# Patient Record
Sex: Female | Born: 1985 | Race: White | Hispanic: No | Marital: Married | Smoking: Never smoker
Health system: Southern US, Community
[De-identification: ages and names within clinical notes are randomized; demographics above are authoritative.]

## PROBLEM LIST (undated history)

## (undated) DIAGNOSIS — M549 Dorsalgia, unspecified: Secondary | ICD-10-CM

## (undated) DIAGNOSIS — M5430 Sciatica, unspecified side: Secondary | ICD-10-CM

## (undated) DIAGNOSIS — F419 Anxiety disorder, unspecified: Secondary | ICD-10-CM

## (undated) DIAGNOSIS — R87619 Unspecified abnormal cytological findings in specimens from cervix uteri: Secondary | ICD-10-CM

## (undated) DIAGNOSIS — K829 Disease of gallbladder, unspecified: Secondary | ICD-10-CM

## (undated) DIAGNOSIS — M2550 Pain in unspecified joint: Secondary | ICD-10-CM

## (undated) DIAGNOSIS — K5900 Constipation, unspecified: Secondary | ICD-10-CM

## (undated) DIAGNOSIS — E559 Vitamin D deficiency, unspecified: Secondary | ICD-10-CM

## (undated) DIAGNOSIS — E039 Hypothyroidism, unspecified: Secondary | ICD-10-CM

## (undated) DIAGNOSIS — E785 Hyperlipidemia, unspecified: Secondary | ICD-10-CM

## (undated) DIAGNOSIS — E079 Disorder of thyroid, unspecified: Secondary | ICD-10-CM

## (undated) DIAGNOSIS — I10 Essential (primary) hypertension: Secondary | ICD-10-CM

## (undated) DIAGNOSIS — E282 Polycystic ovarian syndrome: Secondary | ICD-10-CM

## (undated) DIAGNOSIS — R079 Chest pain, unspecified: Secondary | ICD-10-CM

## (undated) HISTORY — DX: Disease of gallbladder, unspecified: K82.9

## (undated) HISTORY — DX: Vitamin D deficiency, unspecified: E55.9

## (undated) HISTORY — DX: Polycystic ovarian syndrome: E28.2

## (undated) HISTORY — DX: Pain in unspecified joint: M25.50

## (undated) HISTORY — PX: OOPHORECTOMY: SHX86

## (undated) HISTORY — DX: Constipation, unspecified: K59.00

## (undated) HISTORY — DX: Dorsalgia, unspecified: M54.9

## (undated) HISTORY — DX: Unspecified abnormal cytological findings in specimens from cervix uteri: R87.619

## (undated) HISTORY — DX: Anxiety disorder, unspecified: F41.9

## (undated) HISTORY — DX: Essential (primary) hypertension: I10

## (undated) HISTORY — DX: Hypothyroidism, unspecified: E03.9

## (undated) HISTORY — DX: Hyperlipidemia, unspecified: E78.5

## (undated) HISTORY — DX: Chest pain, unspecified: R07.9

## (undated) HISTORY — DX: Sciatica, unspecified side: M54.30

## (undated) HISTORY — DX: Disorder of thyroid, unspecified: E07.9

---
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
## 2008-05-10 HISTORY — PX: CHOLECYSTECTOMY: SHX55

## 2013-05-10 DIAGNOSIS — R87619 Unspecified abnormal cytological findings in specimens from cervix uteri: Secondary | ICD-10-CM

## 2013-05-10 HISTORY — DX: Unspecified abnormal cytological findings in specimens from cervix uteri: R87.619

## 2013-05-10 HISTORY — PX: DILATION AND CURETTAGE OF UTERUS: SHX78

## 2016-07-26 DIAGNOSIS — E039 Hypothyroidism, unspecified: Secondary | ICD-10-CM | POA: Diagnosis not present

## 2016-07-26 DIAGNOSIS — Z0001 Encounter for general adult medical examination with abnormal findings: Secondary | ICD-10-CM | POA: Diagnosis not present

## 2016-07-26 DIAGNOSIS — E559 Vitamin D deficiency, unspecified: Secondary | ICD-10-CM | POA: Diagnosis not present

## 2016-07-30 DIAGNOSIS — E559 Vitamin D deficiency, unspecified: Secondary | ICD-10-CM | POA: Diagnosis not present

## 2016-07-30 DIAGNOSIS — Z131 Encounter for screening for diabetes mellitus: Secondary | ICD-10-CM | POA: Diagnosis not present

## 2016-07-30 DIAGNOSIS — E039 Hypothyroidism, unspecified: Secondary | ICD-10-CM | POA: Diagnosis not present

## 2016-07-30 DIAGNOSIS — Z13220 Encounter for screening for lipoid disorders: Secondary | ICD-10-CM | POA: Diagnosis not present

## 2016-09-01 ENCOUNTER — Other Ambulatory Visit (HOSPITAL_COMMUNITY)
Admission: RE | Admit: 2016-09-01 | Discharge: 2016-09-01 | Disposition: A | Discharge status: Home / Self Care | Payer: 59 | Source: Ambulatory Visit | Attending: Obstetrics and Gynecology | Admitting: Obstetrics and Gynecology

## 2016-09-01 ENCOUNTER — Other Ambulatory Visit: Payer: Self-pay | Admitting: Obstetrics and Gynecology

## 2016-09-01 DIAGNOSIS — Z1151 Encounter for screening for human papillomavirus (HPV): Secondary | ICD-10-CM | POA: Insufficient documentation

## 2016-09-01 DIAGNOSIS — R102 Pelvic and perineal pain: Secondary | ICD-10-CM | POA: Diagnosis not present

## 2016-09-01 DIAGNOSIS — Z01419 Encounter for gynecological examination (general) (routine) without abnormal findings: Secondary | ICD-10-CM | POA: Diagnosis not present

## 2016-09-03 LAB — CYTOLOGY - PAP
Chlamydia: NEGATIVE
Diagnosis: NEGATIVE
HPV: NOT DETECTED
Neisseria Gonorrhea: NEGATIVE

## 2016-10-05 DIAGNOSIS — N926 Irregular menstruation, unspecified: Secondary | ICD-10-CM | POA: Diagnosis not present

## 2016-10-05 DIAGNOSIS — R7303 Prediabetes: Secondary | ICD-10-CM | POA: Diagnosis not present

## 2016-10-05 DIAGNOSIS — R1900 Intra-abdominal and pelvic swelling, mass and lump, unspecified site: Secondary | ICD-10-CM | POA: Diagnosis not present

## 2016-10-05 DIAGNOSIS — R102 Pelvic and perineal pain: Secondary | ICD-10-CM | POA: Diagnosis not present

## 2016-10-12 ENCOUNTER — Other Ambulatory Visit: Payer: Self-pay | Admitting: Obstetrics and Gynecology

## 2016-10-12 DIAGNOSIS — R1900 Intra-abdominal and pelvic swelling, mass and lump, unspecified site: Secondary | ICD-10-CM

## 2016-10-14 ENCOUNTER — Ambulatory Visit
Admission: RE | Admit: 2016-10-14 | Discharge: 2016-10-14 | Disposition: A | Discharge status: Home / Self Care | Payer: 59 | Source: Ambulatory Visit | Attending: Obstetrics and Gynecology | Admitting: Obstetrics and Gynecology

## 2016-10-14 DIAGNOSIS — R1900 Intra-abdominal and pelvic swelling, mass and lump, unspecified site: Secondary | ICD-10-CM

## 2016-10-14 MED ORDER — IOPAMIDOL (ISOVUE-300) INJECTION 61%
125.0000 mL | Freq: Once | INTRAVENOUS | Status: AC | PRN
  Administered 2016-10-14: 125 mL via INTRAVENOUS

## 2016-11-15 DIAGNOSIS — R102 Pelvic and perineal pain: Secondary | ICD-10-CM | POA: Diagnosis not present

## 2016-12-01 DIAGNOSIS — N912 Amenorrhea, unspecified: Secondary | ICD-10-CM | POA: Diagnosis not present

## 2016-12-03 DIAGNOSIS — N911 Secondary amenorrhea: Secondary | ICD-10-CM | POA: Diagnosis not present

## 2016-12-10 DIAGNOSIS — N911 Secondary amenorrhea: Secondary | ICD-10-CM | POA: Diagnosis not present

## 2016-12-13 ENCOUNTER — Ambulatory Visit: Payer: 59 | Attending: Gynecology | Admitting: Gynecology

## 2016-12-13 ENCOUNTER — Encounter: Payer: Self-pay | Admitting: Gynecology

## 2016-12-13 VITALS — BP 133/81 | HR 70 | Temp 98.6°F | Resp 16 | Ht 68.0 in | Wt 253.0 lb

## 2016-12-13 DIAGNOSIS — Z3A01 Less than 8 weeks gestation of pregnancy: Secondary | ICD-10-CM | POA: Diagnosis not present

## 2016-12-13 DIAGNOSIS — O3481 Maternal care for other abnormalities of pelvic organs, first trimester: Secondary | ICD-10-CM | POA: Diagnosis not present

## 2016-12-13 DIAGNOSIS — O26891 Other specified pregnancy related conditions, first trimester: Secondary | ICD-10-CM | POA: Insufficient documentation

## 2016-12-13 DIAGNOSIS — N839 Noninflammatory disorder of ovary, fallopian tube and broad ligament, unspecified: Secondary | ICD-10-CM

## 2016-12-13 DIAGNOSIS — E079 Disorder of thyroid, unspecified: Secondary | ICD-10-CM | POA: Diagnosis not present

## 2016-12-13 DIAGNOSIS — Z79899 Other long term (current) drug therapy: Secondary | ICD-10-CM | POA: Diagnosis not present

## 2016-12-13 DIAGNOSIS — O99282 Endocrine, nutritional and metabolic diseases complicating pregnancy, second trimester: Secondary | ICD-10-CM | POA: Diagnosis not present

## 2016-12-13 DIAGNOSIS — R1900 Intra-abdominal and pelvic swelling, mass and lump, unspecified site: Secondary | ICD-10-CM

## 2016-12-13 NOTE — Patient Instructions (Signed)
Plan to follow up with Dr. Andrey Farmerossi after having ultrasound at 14 weeks to discuss potential surgery.  Please call [phone] for any questions or concerns.

## 2016-12-13 NOTE — Progress Notes (Signed)
Consult Note: Gyn-Onc   Cathy Vargas Cathy Vargas 31 y.o. female  Chief Complaint  Patient presents with  . 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 A CT scan obtained previously (10/14/2016) measured the mass at 6.1 x 7.5 x 6.8 cm. The patient has negative tumor markers including CA-125 and CEA.  Plan: I lengthy discussion with the patient indicating that I believe this is a benign dermoid. The pros and cons of surgical removal versus observation to pregnancy were discussed. If surgical intervention was to be undertaken we recommend this be done between 16 and [redacted] weeks gestation. I believe this can be done with minimally invasive surgery. Our intention would be to perform a cystectomy and attempt to preserve normal ovarian tissue if at all possible.  Going forward, and recommend the patient have a repeat ultrasound at [redacted] weeks gestation and see Dr. Adolphus BirchwoodEmma Rossi in consultation at 15 weeks. If at that time select to proceed with surgery we would have window of 16-18 weeks to perform surgery. On the other hand the patient may choose to be observed through pregnancy and have the cyst removed postpartum. We did emphasize the possibility of ovarian torsion during the pregnancy which might necessitate urgent surgery. The patient will consider all of these options and we will follow through with the plan above.    HPI: 31 year old white female gravida 2 para 1 seen in consultation at the request of Dr. Elon SpannerLeger regarding management of a left ovarian mass. Patient initially developed some left lower quadrant discomfort and had a CT scan on 10/14/2016 which revealed a 6.1 x 7.5 x 6.8 cm left ovarian teratoma included internal calcification large amount of internal fat. Subsequently the patient's had 3 ultrasounds that show the  mass to be stable. In the interval discovered the patient has an intrauterine pregnancy at approximate 7 weeks' gestation today she has no pelvic or abdominal symptoms and feels well.  Review of Systems:10 point review of systems is negative except as noted in history.   Vitals: Blood pressure 133/81, pulse 70, temperature 98.6 F (37 C), temperature source Oral, resp. rate 16, height 5\' 8"  (1.727 m), weight 253 lb (114.8 kg), SpO2 100 %.  Physical Exam: General : The patient is a healthy woman in no acute distress.  HEENT: normocephalic, extraoccular movements normal; neck is supple without thyromegally  Lynphnodes: Supraclavicular and inguinal nodes not enlarged  Abdomen: Soft, non-tender, no ascites, no organomegally, no masses, no hernias  Lower extremities: No edema or varicosities. Normal range of motion      No Known Allergies  Past Medical History:  Diagnosis Date  . Abnormal Pap smear of cervix 2015  . Gallbladder disease   . Thyroid disease     Past Surgical History:  Procedure Laterality Date  . CHOLECYSTECTOMY  2010  . DILATION AND CURETTAGE OF UTERUS  2015  . WISDOM TOOTH EXTRACTION  2001    Current Outpatient Prescriptions  Medication Sig Dispense Refill  . levothyroxine (SYNTHROID, LEVOTHROID) 150 MCG tablet 1 TABLET ON AN EMPTY STOMACH IN THE MORNING ONCE A DAY  0  . loratadine (CLARITIN) 10 MG tablet Take 10 mg by mouth daily.    . propranolol (INDERAL) 60 MG tablet Take 60 mg by mouth at bedtime.     No current facility-administered medications  for this visit.     Social History   Social History  . Marital status: Unknown    Spouse name: N/A  . Number of children: N/A  . Years of education: N/A   Occupational History  . Not on file.   Social History Main Topics  . Smoking status: Never Smoker  . Smokeless tobacco: Never Used  . Alcohol use No  . Drug use: No  . Sexual activity: Yes   Other Topics Concern  . Not on file   Social History  Narrative  . No narrative on file    History reviewed. No pertinent family history.    De Blanch, MD 12/13/2016, 10:35 AM

## 2016-12-31 DIAGNOSIS — R1904 Left lower quadrant abdominal swelling, mass and lump: Secondary | ICD-10-CM | POA: Diagnosis not present

## 2017-01-11 DIAGNOSIS — E559 Vitamin D deficiency, unspecified: Secondary | ICD-10-CM | POA: Diagnosis not present

## 2017-01-11 DIAGNOSIS — E039 Hypothyroidism, unspecified: Secondary | ICD-10-CM | POA: Diagnosis not present

## 2017-01-28 ENCOUNTER — Telehealth: Payer: Self-pay | Admitting: *Deleted

## 2017-01-28 NOTE — Telephone Encounter (Signed)
Patient called and canceled appt for October 1st. Patient stated that "I'm having the surgery with a different surgeon."

## 2017-01-31 ENCOUNTER — Other Ambulatory Visit: Payer: Self-pay

## 2017-02-07 ENCOUNTER — Ambulatory Visit: Payer: 59 | Admitting: Gynecologic Oncology

## 2017-02-10 DIAGNOSIS — H40033 Anatomical narrow angle, bilateral: Secondary | ICD-10-CM | POA: Diagnosis not present

## 2017-02-28 DIAGNOSIS — H40031 Anatomical narrow angle, right eye: Secondary | ICD-10-CM | POA: Diagnosis not present

## 2017-03-14 DIAGNOSIS — Z719 Counseling, unspecified: Secondary | ICD-10-CM | POA: Diagnosis not present

## 2017-03-17 DIAGNOSIS — M9903 Segmental and somatic dysfunction of lumbar region: Secondary | ICD-10-CM | POA: Diagnosis not present

## 2017-03-17 DIAGNOSIS — M9901 Segmental and somatic dysfunction of cervical region: Secondary | ICD-10-CM | POA: Diagnosis not present

## 2017-03-17 DIAGNOSIS — M9902 Segmental and somatic dysfunction of thoracic region: Secondary | ICD-10-CM | POA: Diagnosis not present

## 2017-03-18 DIAGNOSIS — M9902 Segmental and somatic dysfunction of thoracic region: Secondary | ICD-10-CM | POA: Diagnosis not present

## 2017-03-18 DIAGNOSIS — M9901 Segmental and somatic dysfunction of cervical region: Secondary | ICD-10-CM | POA: Diagnosis not present

## 2017-03-18 DIAGNOSIS — M9903 Segmental and somatic dysfunction of lumbar region: Secondary | ICD-10-CM | POA: Diagnosis not present

## 2017-03-24 DIAGNOSIS — Z719 Counseling, unspecified: Secondary | ICD-10-CM | POA: Diagnosis not present

## 2017-07-07 DIAGNOSIS — R69 Illness, unspecified: Secondary | ICD-10-CM | POA: Diagnosis not present

## 2017-07-27 DIAGNOSIS — M9902 Segmental and somatic dysfunction of thoracic region: Secondary | ICD-10-CM | POA: Diagnosis not present

## 2017-07-27 DIAGNOSIS — M542 Cervicalgia: Secondary | ICD-10-CM | POA: Diagnosis not present

## 2017-07-27 DIAGNOSIS — M9901 Segmental and somatic dysfunction of cervical region: Secondary | ICD-10-CM | POA: Diagnosis not present

## 2017-08-01 DIAGNOSIS — M542 Cervicalgia: Secondary | ICD-10-CM | POA: Diagnosis not present

## 2017-08-01 DIAGNOSIS — M9902 Segmental and somatic dysfunction of thoracic region: Secondary | ICD-10-CM | POA: Diagnosis not present

## 2017-08-01 DIAGNOSIS — M9901 Segmental and somatic dysfunction of cervical region: Secondary | ICD-10-CM | POA: Diagnosis not present

## 2017-08-03 DIAGNOSIS — M9902 Segmental and somatic dysfunction of thoracic region: Secondary | ICD-10-CM | POA: Diagnosis not present

## 2017-08-03 DIAGNOSIS — M542 Cervicalgia: Secondary | ICD-10-CM | POA: Diagnosis not present

## 2017-08-03 DIAGNOSIS — M9901 Segmental and somatic dysfunction of cervical region: Secondary | ICD-10-CM | POA: Diagnosis not present

## 2017-08-04 DIAGNOSIS — M9901 Segmental and somatic dysfunction of cervical region: Secondary | ICD-10-CM | POA: Diagnosis not present

## 2017-08-04 DIAGNOSIS — M542 Cervicalgia: Secondary | ICD-10-CM | POA: Diagnosis not present

## 2017-08-04 DIAGNOSIS — M9902 Segmental and somatic dysfunction of thoracic region: Secondary | ICD-10-CM | POA: Diagnosis not present

## 2017-08-08 DIAGNOSIS — M9902 Segmental and somatic dysfunction of thoracic region: Secondary | ICD-10-CM | POA: Diagnosis not present

## 2017-08-08 DIAGNOSIS — M9901 Segmental and somatic dysfunction of cervical region: Secondary | ICD-10-CM | POA: Diagnosis not present

## 2017-08-08 DIAGNOSIS — M542 Cervicalgia: Secondary | ICD-10-CM | POA: Diagnosis not present

## 2017-08-10 DIAGNOSIS — M9902 Segmental and somatic dysfunction of thoracic region: Secondary | ICD-10-CM | POA: Diagnosis not present

## 2017-08-10 DIAGNOSIS — M9901 Segmental and somatic dysfunction of cervical region: Secondary | ICD-10-CM | POA: Diagnosis not present

## 2017-08-10 DIAGNOSIS — M542 Cervicalgia: Secondary | ICD-10-CM | POA: Diagnosis not present

## 2017-08-11 DIAGNOSIS — M542 Cervicalgia: Secondary | ICD-10-CM | POA: Diagnosis not present

## 2017-08-11 DIAGNOSIS — M9902 Segmental and somatic dysfunction of thoracic region: Secondary | ICD-10-CM | POA: Diagnosis not present

## 2017-08-11 DIAGNOSIS — M9901 Segmental and somatic dysfunction of cervical region: Secondary | ICD-10-CM | POA: Diagnosis not present

## 2017-08-17 DIAGNOSIS — M9901 Segmental and somatic dysfunction of cervical region: Secondary | ICD-10-CM | POA: Diagnosis not present

## 2017-08-17 DIAGNOSIS — M542 Cervicalgia: Secondary | ICD-10-CM | POA: Diagnosis not present

## 2017-08-17 DIAGNOSIS — M9902 Segmental and somatic dysfunction of thoracic region: Secondary | ICD-10-CM | POA: Diagnosis not present

## 2017-08-18 DIAGNOSIS — M9902 Segmental and somatic dysfunction of thoracic region: Secondary | ICD-10-CM | POA: Diagnosis not present

## 2017-08-18 DIAGNOSIS — M9901 Segmental and somatic dysfunction of cervical region: Secondary | ICD-10-CM | POA: Diagnosis not present

## 2017-08-18 DIAGNOSIS — M542 Cervicalgia: Secondary | ICD-10-CM | POA: Diagnosis not present

## 2017-08-19 DIAGNOSIS — M9901 Segmental and somatic dysfunction of cervical region: Secondary | ICD-10-CM | POA: Diagnosis not present

## 2017-08-19 DIAGNOSIS — M9902 Segmental and somatic dysfunction of thoracic region: Secondary | ICD-10-CM | POA: Diagnosis not present

## 2017-08-19 DIAGNOSIS — M542 Cervicalgia: Secondary | ICD-10-CM | POA: Diagnosis not present

## 2017-08-22 DIAGNOSIS — M9902 Segmental and somatic dysfunction of thoracic region: Secondary | ICD-10-CM | POA: Diagnosis not present

## 2017-08-22 DIAGNOSIS — M542 Cervicalgia: Secondary | ICD-10-CM | POA: Diagnosis not present

## 2017-08-22 DIAGNOSIS — M9901 Segmental and somatic dysfunction of cervical region: Secondary | ICD-10-CM | POA: Diagnosis not present

## 2017-08-24 DIAGNOSIS — M9902 Segmental and somatic dysfunction of thoracic region: Secondary | ICD-10-CM | POA: Diagnosis not present

## 2017-08-24 DIAGNOSIS — M542 Cervicalgia: Secondary | ICD-10-CM | POA: Diagnosis not present

## 2017-08-24 DIAGNOSIS — M9901 Segmental and somatic dysfunction of cervical region: Secondary | ICD-10-CM | POA: Diagnosis not present

## 2017-08-25 DIAGNOSIS — M9902 Segmental and somatic dysfunction of thoracic region: Secondary | ICD-10-CM | POA: Diagnosis not present

## 2017-08-25 DIAGNOSIS — M9901 Segmental and somatic dysfunction of cervical region: Secondary | ICD-10-CM | POA: Diagnosis not present

## 2017-08-25 DIAGNOSIS — M542 Cervicalgia: Secondary | ICD-10-CM | POA: Diagnosis not present

## 2017-08-31 DIAGNOSIS — M9902 Segmental and somatic dysfunction of thoracic region: Secondary | ICD-10-CM | POA: Diagnosis not present

## 2017-08-31 DIAGNOSIS — M542 Cervicalgia: Secondary | ICD-10-CM | POA: Diagnosis not present

## 2017-08-31 DIAGNOSIS — M9901 Segmental and somatic dysfunction of cervical region: Secondary | ICD-10-CM | POA: Diagnosis not present

## 2017-09-01 DIAGNOSIS — M9901 Segmental and somatic dysfunction of cervical region: Secondary | ICD-10-CM | POA: Diagnosis not present

## 2017-09-01 DIAGNOSIS — M542 Cervicalgia: Secondary | ICD-10-CM | POA: Diagnosis not present

## 2017-09-01 DIAGNOSIS — M9902 Segmental and somatic dysfunction of thoracic region: Secondary | ICD-10-CM | POA: Diagnosis not present

## 2017-09-05 DIAGNOSIS — M542 Cervicalgia: Secondary | ICD-10-CM | POA: Diagnosis not present

## 2017-09-05 DIAGNOSIS — M9901 Segmental and somatic dysfunction of cervical region: Secondary | ICD-10-CM | POA: Diagnosis not present

## 2017-09-05 DIAGNOSIS — M9902 Segmental and somatic dysfunction of thoracic region: Secondary | ICD-10-CM | POA: Diagnosis not present

## 2017-09-06 DIAGNOSIS — M9901 Segmental and somatic dysfunction of cervical region: Secondary | ICD-10-CM | POA: Diagnosis not present

## 2017-09-06 DIAGNOSIS — M542 Cervicalgia: Secondary | ICD-10-CM | POA: Diagnosis not present

## 2017-09-06 DIAGNOSIS — M9902 Segmental and somatic dysfunction of thoracic region: Secondary | ICD-10-CM | POA: Diagnosis not present

## 2017-09-08 DIAGNOSIS — M542 Cervicalgia: Secondary | ICD-10-CM | POA: Diagnosis not present

## 2017-09-08 DIAGNOSIS — M9901 Segmental and somatic dysfunction of cervical region: Secondary | ICD-10-CM | POA: Diagnosis not present

## 2017-09-08 DIAGNOSIS — M9902 Segmental and somatic dysfunction of thoracic region: Secondary | ICD-10-CM | POA: Diagnosis not present

## 2017-09-12 DIAGNOSIS — M9902 Segmental and somatic dysfunction of thoracic region: Secondary | ICD-10-CM | POA: Diagnosis not present

## 2017-09-12 DIAGNOSIS — M542 Cervicalgia: Secondary | ICD-10-CM | POA: Diagnosis not present

## 2017-09-12 DIAGNOSIS — M9901 Segmental and somatic dysfunction of cervical region: Secondary | ICD-10-CM | POA: Diagnosis not present

## 2017-09-16 DIAGNOSIS — M9901 Segmental and somatic dysfunction of cervical region: Secondary | ICD-10-CM | POA: Diagnosis not present

## 2017-09-16 DIAGNOSIS — M9902 Segmental and somatic dysfunction of thoracic region: Secondary | ICD-10-CM | POA: Diagnosis not present

## 2017-09-16 DIAGNOSIS — M542 Cervicalgia: Secondary | ICD-10-CM | POA: Diagnosis not present

## 2017-09-19 DIAGNOSIS — M542 Cervicalgia: Secondary | ICD-10-CM | POA: Diagnosis not present

## 2017-09-19 DIAGNOSIS — M9902 Segmental and somatic dysfunction of thoracic region: Secondary | ICD-10-CM | POA: Diagnosis not present

## 2017-09-19 DIAGNOSIS — M9901 Segmental and somatic dysfunction of cervical region: Secondary | ICD-10-CM | POA: Diagnosis not present

## 2017-09-27 DIAGNOSIS — M9901 Segmental and somatic dysfunction of cervical region: Secondary | ICD-10-CM | POA: Diagnosis not present

## 2017-09-27 DIAGNOSIS — M9902 Segmental and somatic dysfunction of thoracic region: Secondary | ICD-10-CM | POA: Diagnosis not present

## 2017-09-27 DIAGNOSIS — M542 Cervicalgia: Secondary | ICD-10-CM | POA: Diagnosis not present

## 2017-10-10 DIAGNOSIS — Z0000 Encounter for general adult medical examination without abnormal findings: Secondary | ICD-10-CM | POA: Diagnosis not present

## 2017-10-10 DIAGNOSIS — Z23 Encounter for immunization: Secondary | ICD-10-CM | POA: Diagnosis not present

## 2017-10-10 DIAGNOSIS — Z136 Encounter for screening for cardiovascular disorders: Secondary | ICD-10-CM | POA: Diagnosis not present

## 2018-01-04 DIAGNOSIS — Z01419 Encounter for gynecological examination (general) (routine) without abnormal findings: Secondary | ICD-10-CM | POA: Diagnosis not present

## 2018-01-05 ENCOUNTER — Encounter (INDEPENDENT_AMBULATORY_CARE_PROVIDER_SITE_OTHER): Payer: Commercial Managed Care - HMO

## 2018-01-18 ENCOUNTER — Encounter (INDEPENDENT_AMBULATORY_CARE_PROVIDER_SITE_OTHER): Payer: Self-pay | Admitting: Family Medicine

## 2018-01-18 ENCOUNTER — Ambulatory Visit (INDEPENDENT_AMBULATORY_CARE_PROVIDER_SITE_OTHER): Payer: 59 | Admitting: Family Medicine

## 2018-01-18 VITALS — BP 125/84 | HR 66 | Temp 98.1°F | Ht 68.0 in | Wt 248.0 lb

## 2018-01-18 DIAGNOSIS — Z9189 Other specified personal risk factors, not elsewhere classified: Secondary | ICD-10-CM | POA: Diagnosis not present

## 2018-01-18 DIAGNOSIS — Z6837 Body mass index (BMI) 37.0-37.9, adult: Secondary | ICD-10-CM

## 2018-01-18 DIAGNOSIS — I10 Essential (primary) hypertension: Secondary | ICD-10-CM

## 2018-01-18 DIAGNOSIS — Z1331 Encounter for screening for depression: Secondary | ICD-10-CM

## 2018-01-18 DIAGNOSIS — R5383 Other fatigue: Secondary | ICD-10-CM

## 2018-01-18 DIAGNOSIS — E038 Other specified hypothyroidism: Secondary | ICD-10-CM | POA: Diagnosis not present

## 2018-01-18 DIAGNOSIS — R0602 Shortness of breath: Secondary | ICD-10-CM

## 2018-01-18 DIAGNOSIS — Z0289 Encounter for other administrative examinations: Secondary | ICD-10-CM

## 2018-01-18 NOTE — Progress Notes (Signed)
Office: [phone]  /  Fax: [phone]   Dear Levonne Lapping, NP,   Thank you for referring Cathy Vargas to our clinic. The following note includes my evaluation and treatment recommendations.  HPI:   Chief Complaint: OBESITY    Cathy Vargas has been referred by Levonne Lapping, NP for consultation regarding her obesity and obesity related comorbidities.    Cathy Vargas (MR# [id number]) is a 32 y.o. female who presents on 01/18/2018 for obesity evaluation and treatment. Current BMI is Body mass index is 37.71 kg/m.Cathy Vargas has been struggling with her weight for many years and has been unsuccessful in either losing weight, maintaining weight loss, or reaching her healthy weight goal. Cathy Vargas thinks she may have had bulimia in [employer] with almost daily vomiting, but she thought this was "nervous stomach". Azya denies any urge to purge now.     Rickia attended our information session and states she is currently in the action stage of change and ready to dedicate time achieving and maintaining a healthier weight. Cathy Vargas is interested in becoming our patient and working on intensive lifestyle modifications including (but not limited to) diet, exercise and weight loss.    Cathy Vargas states her family eats meals together she thinks her family will eat healthier with  her her desired weight loss is 92 lbs she has been heavy most of  her life she started gaining weight after going to college her heaviest weight ever was 255 lbs. she has significant food cravings issues  she snacks frequently in the evenings she skips meals frequently she frequently makes poor food choices she frequently eats larger portions than normal  she has binge eating behaviors she struggles with emotional eating    Fatigue Cathy Vargas feels her energy is lower than it should be. This has worsened with weight gain and has not worsened recently. Cathy Vargas admits to daytime somnolence and  admits to waking up still tired. Patient  states she already had a sleep study and she doesn't have sleep apnea.  Patent has a history of symptoms of daytime fatigue, morning fatigue, morning headache and hypertension. Patient generally gets 8 hours of sleep per night, and states they generally have restful sleep. Snoring is present. Apneic episodes are not present. Epworth Sleepiness Score is 5  Dyspnea on exertion Cathy Vargas notes increasing shortness of breath with exercising and seems to be worsening over time with weight gain. She notes getting out of breath sooner with activity than she used to. This has not gotten worse recently. Cathy Vargas denies orthopnea.  Hypertension Cathy Vargas is a 32 y.o. female with hypertension. Cathy Vargas denies chest pain or headache. She is working weight loss to help control her blood pressure with the goal of decreasing her risk of heart attack and stroke. Cathy Vargas blood pressure is stable on medications.  At risk for cardiovascular disease Cathy Vargas is at a higher than average risk for cardiovascular disease due to obesity and hypertension. She currently denies any chest pain.  Hypothyroid Cathy Vargas has a diagnosis of hypothyroidism. She is on synthroid. She denies hot or cold intolerance or palpitations, but does admit to ongoing fatigue.  Depression Screen Cathy Vargas's Food and Mood (modified PHQ-9) score was  Depression screen PHQ 2/9 01/18/2018  Decreased Interest 3  Down, Depressed, Hopeless 3  PHQ - 2 Score 6  Altered sleeping 3  Tired, decreased energy 3  Change in appetite 3  Feeling bad or failure about yourself  2  Trouble concentrating  1  Moving slowly or fidgety/restless 0  Suicidal thoughts 0  PHQ-9 Score 18  Difficult doing work/chores Very difficult    ALLERGIES: No Known Allergies  MEDICATIONS: Current Outpatient Medications on File Prior to Visit  Medication Sig Dispense Refill  . Cholecalciferol 4000 units CAPS Take 1 capsule by mouth daily.    Cathy Kitchen levothyroxine (SYNTHROID, LEVOTHROID) 150  MCG tablet 1 TABLET ON AN EMPTY STOMACH IN THE MORNING ONCE A DAY  0  . loratadine (CLARITIN) 10 MG tablet Take 10 mg by mouth daily.    . Melatonin 3 MG TABS Take 1 tablet by mouth at bedtime.    . Norethindrone-Ethinyl Estradiol-Fe Biphas (LO LOESTRIN FE) 1 MG-10 MCG / 10 MCG tablet Take 1 tablet by mouth daily.    . propranolol (INDERAL) 60 MG tablet Take 60 mg by mouth at bedtime.     No current facility-administered medications on file prior to visit.     PAST MEDICAL HISTORY: Past Medical History:  Diagnosis Date  . Abnormal Pap smear of cervix 2015  . Anxiety   . Back pain   . Gallbladder disease   . Gallbladder problem   . HTN (hypertension)   . Hypothyroidism   . Joint pain   . PCOS (polycystic ovarian syndrome)   . Sciatica   . Thyroid disease   . Vitamin D deficiency     PAST SURGICAL HISTORY: Past Surgical History:  Procedure Laterality Date  . CHOLECYSTECTOMY  2010  . DILATION AND CURETTAGE OF UTERUS  2015  . OOPHORECTOMY    . WISDOM TOOTH EXTRACTION  2001    SOCIAL HISTORY: Social History   Tobacco Use  . Smoking status: Never Smoker  . Smokeless tobacco: Never Used  Substance Use Topics  . Alcohol use: No  . Drug use: No    FAMILY HISTORY: Family History  Problem Relation Age of Onset  . Hypertension Mother   . Obesity Mother   . Hypertension Father   . Alcoholism Father     ROS: Review of Systems  Constitutional: Positive for malaise/fatigue.  Eyes:       Wear Glasses or Contacts  Respiratory: Positive for shortness of breath (on exertion).   Cardiovascular: Negative for chest pain and orthopnea.  Musculoskeletal: Positive for back pain.  Neurological: Negative for headaches.  Endo/Heme/Allergies:       Negative for hot or cold intolerance  Psychiatric/Behavioral: The patient is nervous/anxious (nervousness).     PHYSICAL EXAM: Blood pressure 125/84, pulse 66, temperature 98.1 F (36.7 C), temperature source Oral, height 5\' 8"   (1.727 m), weight 248 lb (112.5 kg), last menstrual period 12/27/2017, SpO2 99 %. Body mass index is 37.71 kg/m. Physical Exam  Constitutional: She is oriented to person, place, and time. She appears well-developed and well-nourished.  HENT:  Nose: Nose normal.  Eyes: EOM are normal. No scleral icterus.  Neck: Normal range of motion. Neck supple. No thyromegaly present.  Cardiovascular: Normal rate and regular rhythm.  Pulmonary/Chest: Effort normal. No respiratory distress.  Abdominal: Soft. There is no tenderness.  +obesity  Musculoskeletal: Normal range of motion.  Range of Motion normal in all 4 extremities  Neurological: She is alert and oriented to person, place, and time. Coordination normal.  Skin: Skin is warm and dry.  Psychiatric: She has a normal mood and affect. Her behavior is normal.  Vitals reviewed.   RECENT LABS AND TESTS: BMET No results found for: NA, K, CL, CO2, GLUCOSE, BUN, CREATININE, CALCIUM, GFRNONAA, GFRAA  No results found for: HGBA1C No results found for: INSULIN CBC No results found for: WBC, RBC, HGB, HCT, PLT, MCV, MCH, MCHC, RDW, LYMPHSABS, MONOABS, EOSABS, BASOSABS Iron/TIBC/Ferritin/ %Sat No results found for: IRON, TIBC, FERRITIN, IRONPCTSAT Lipid Panel  No results found for: CHOL, TRIG, HDL, CHOLHDL, VLDL, LDLCALC, LDLDIRECT Hepatic Function Panel  No results found for: PROT, ALBUMIN, AST, ALT, ALKPHOS, BILITOT, BILIDIR, IBILI No results found for: TSH  ECG  shows NSR with a rate of 73 BPM INDIRECT CALORIMETER done today shows a VO2 of 283 and a REE of 1973.  Her calculated basal metabolic rate is 1610 thus her basal metabolic rate is worse than expected.    ASSESSMENT AND PLAN: Other fatigue - Plan: EKG 12-Lead, Vitamin B12, CBC With Differential, Comprehensive metabolic panel, Folate, Hemoglobin A1c, Insulin, random, Lipid Panel With LDL/HDL Ratio, VITAMIN D 25 Hydroxy (Vit-D Deficiency, Fractures)  Shortness of breath on exertion -  Plan: CBC With Differential  Essential hypertension  Other specified hypothyroidism - Plan: T3, T4, free, TSH  Depression screening  At risk for heart disease  Class 2 severe obesity with serious comorbidity and body mass index (BMI) of 37.0 to 37.9 in adult, unspecified obesity type (HCC)  PLAN: Fatigue Dezaray was informed that her fatigue may be related to obesity, depression or many other causes. Labs will be ordered, and in the meanwhile Violia has agreed to work on diet, exercise and weight loss to help with fatigue. Proper sleep hygiene was discussed including the need for 7-8 hours of quality sleep each night. A sleep study was not ordered based on symptoms and Epworth score.  Dyspnea on exertion Iasia's shortness of breath appears to be obesity related and exercise induced. She has agreed to work on weight loss and gradually increase exercise to treat her exercise induced shortness of breath. If Nelli follows our instructions and loses weight without improvement of her shortness of breath, we will plan to refer to pulmonology. We will monitor this condition regularly. Lynnzie agrees to this plan.  Hypertension We discussed sodium restriction, working on healthy weight loss, and a regular exercise program as the means to achieve improved blood pressure control. Enas agreed with this plan and agreed to follow up as directed. We will continue to monitor her blood pressure as well as her progress with the above lifestyle modifications. She will continue her medications as prescribed and will watch for signs of hypotension as she continues her lifestyle modifications. We will check labs and Cloma agrees to start diet.  Cardiovascular risk counseling Tesia was given extended (15 minutes) coronary artery disease prevention counseling today. She is 32 y.o. female and has risk factors for heart disease including obesity and hypertension. We discussed intensive lifestyle modifications today with an  emphasis on specific weight loss instructions and strategies. Pt was also informed of the importance of increasing exercise and decreasing saturated fats to help prevent heart disease.  Hypothyroid Alyxis was informed of the importance of good thyroid control to help with weight loss efforts. She was also informed that supertheraputic thyroid levels are dangerous and will not improve weight loss results. We will check labs and follow.  Depression Screen Sadey had a strongly positive depression screening. Depression is commonly associated with obesity and often results in emotional eating behaviors. We will monitor this closely and work on CBT to help improve the non-hunger eating patterns. Referral to Psychology may be required if no improvement is seen as she continues in our clinic.  Obesity  Meiko is currently in the action stage of change and her goal is to continue with weight loss efforts. I recommend Mackenna begin the structured treatment plan as follows:  She has agreed to follow the Category 3 plan Rebie has been instructed to eventually work up to a goal of 150 minutes of combined cardio and strengthening exercise per week for weight loss and overall health benefits. We discussed the following Behavioral Modification Strategies today: increasing lean protein intake, decreasing simple carbohydrates , decrease eating out, work on meal planning and easy cooking plans and dealing with family or coworker sabotage   She was informed of the importance of frequent follow up visits to maximize her success with intensive lifestyle modifications for her multiple health conditions. She was informed we would discuss her lab results at her next visit unless there is a critical issue that needs to be addressed sooner. Marcelline agreed to keep her next visit at the agreed upon time to discuss these results.    OBESITY BEHAVIORAL INTERVENTION VISIT  Today's visit was # 1   Starting weight: 248 lbs Starting date:  01/18/18 Today's weight : 248 lbs Today's date: 01/18/2018 Total lbs lost to date: 0   ASK: We discussed the diagnosis of obesity with Marliss Czar today and Dymphna agreed to give [place] permission to discuss obesity behavioral modification therapy today.  ASSESS: Rudell has the diagnosis of obesity and her BMI today is 37.72 Lillianne is in the action stage of change   ADVISE: Zhari was educated on the multiple health risks of obesity as well as the benefit of weight loss to improve her health. She was advised of the need for long term treatment and the importance of lifestyle modifications to improve her current health and to decrease her risk of future health problems.  AGREE: Multiple dietary modification options and treatment options were discussed and  Philip agreed to follow the recommendations documented in the above note.  ARRANGE: Braelin was educated on the importance of frequent visits to treat obesity as outlined per CMS and USPSTF guidelines and agreed to schedule her next follow up appointment today.  I, [place] Crane, am acting as transcriptionist for Quillian Quince, MD  I have reviewed the above documentation for accuracy and completeness, and I agree with the above. -Quillian Quince, MD

## 2018-01-19 ENCOUNTER — Encounter (INDEPENDENT_AMBULATORY_CARE_PROVIDER_SITE_OTHER): Payer: Self-pay | Admitting: Family Medicine

## 2018-01-19 LAB — CBC WITH DIFFERENTIAL
BASOS ABS: 0 10*3/uL (ref 0.0–0.2)
Basos: 0 %
EOS (ABSOLUTE): 0.1 10*3/uL (ref 0.0–0.4)
Eos: 2 %
Hematocrit: 39.7 % (ref 34.0–46.6)
Hemoglobin: 13.2 g/dL (ref 11.1–15.9)
Immature Grans (Abs): 0 10*3/uL (ref 0.0–0.1)
Immature Granulocytes: 0 %
LYMPHS ABS: 1.3 10*3/uL (ref 0.7–3.1)
Lymphs: 24 %
MCH: 29.7 pg (ref 26.6–33.0)
MCHC: 33.2 g/dL (ref 31.5–35.7)
MCV: 89 fL (ref 79–97)
Monocytes Absolute: 0.2 10*3/uL (ref 0.1–0.9)
Monocytes: 4 %
NEUTROS ABS: 4 10*3/uL (ref 1.4–7.0)
Neutrophils: 70 %
RBC: 4.44 x10E6/uL (ref 3.77–5.28)
RDW: 14 % (ref 12.3–15.4)
WBC: 5.7 10*3/uL (ref 3.4–10.8)

## 2018-01-19 LAB — COMPREHENSIVE METABOLIC PANEL
A/G RATIO: 1.7 (ref 1.2–2.2)
ALBUMIN: 4.2 g/dL (ref 3.5–5.5)
ALK PHOS: 52 IU/L (ref 39–117)
ALT: 15 IU/L (ref 0–32)
AST: 14 IU/L (ref 0–40)
BILIRUBIN TOTAL: 0.2 mg/dL (ref 0.0–1.2)
BUN / CREAT RATIO: 17 (ref 9–23)
BUN: 11 mg/dL (ref 6–20)
CHLORIDE: 102 mmol/L (ref 96–106)
CO2: 22 mmol/L (ref 20–29)
Calcium: 9.1 mg/dL (ref 8.7–10.2)
Creatinine, Ser: 0.65 mg/dL (ref 0.57–1.00)
GFR calc Af Amer: 134 mL/min/{1.73_m2} (ref >59)
GFR calc non Af Amer: 116 mL/min/{1.73_m2} (ref >59)
GLUCOSE: 82 mg/dL (ref 65–99)
Globulin, Total: 2.5 g/dL (ref 1.5–4.5)
POTASSIUM: 4.3 mmol/L (ref 3.5–5.2)
SODIUM: 140 mmol/L (ref 134–144)
Total Protein: 6.7 g/dL (ref 6.0–8.5)

## 2018-01-19 LAB — LIPID PANEL WITH LDL/HDL RATIO
Cholesterol, Total: 181 mg/dL (ref 100–199)
HDL: 46 mg/dL (ref >39)
LDL CALC: 110 mg/dL — AB (ref 0–99)
LDL/HDL RATIO: 2.4 ratio (ref 0.0–3.2)
Triglycerides: 124 mg/dL (ref 0–149)
VLDL CHOLESTEROL CAL: 25 mg/dL (ref 5–40)

## 2018-01-19 LAB — VITAMIN B12: Vitamin B-12: 268 pg/mL (ref 232–1245)

## 2018-01-19 LAB — T4, FREE: FREE T4: 1.35 ng/dL (ref 0.82–1.77)

## 2018-01-19 LAB — HEMOGLOBIN A1C
Est. average glucose Bld gHb Est-mCnc: 111 mg/dL
Hgb A1c MFr Bld: 5.5 % (ref 4.8–5.6)

## 2018-01-19 LAB — INSULIN, RANDOM: INSULIN: 22 u[IU]/mL (ref 2.6–24.9)

## 2018-01-19 LAB — TSH: TSH: 2.26 u[IU]/mL (ref 0.450–4.500)

## 2018-01-19 LAB — VITAMIN D 25 HYDROXY (VIT D DEFICIENCY, FRACTURES): VIT D 25 HYDROXY: 56.8 ng/mL (ref 30.0–100.0)

## 2018-01-19 LAB — T3: T3 TOTAL: 169 ng/dL (ref 71–180)

## 2018-01-19 LAB — FOLATE: FOLATE: 8.8 ng/mL (ref >3.0)

## 2018-02-02 ENCOUNTER — Ambulatory Visit (INDEPENDENT_AMBULATORY_CARE_PROVIDER_SITE_OTHER): Payer: 59 | Admitting: Family Medicine

## 2018-02-02 VITALS — BP 131/86 | HR 84 | Temp 98.2°F | Ht 68.0 in | Wt 240.0 lb

## 2018-02-02 DIAGNOSIS — R21 Rash and other nonspecific skin eruption: Secondary | ICD-10-CM | POA: Diagnosis not present

## 2018-02-02 DIAGNOSIS — Z6836 Body mass index (BMI) 36.0-36.9, adult: Secondary | ICD-10-CM | POA: Diagnosis not present

## 2018-02-02 DIAGNOSIS — E8881 Metabolic syndrome: Secondary | ICD-10-CM

## 2018-02-06 NOTE — Progress Notes (Signed)
Office: [phone]  /  Fax: [phone]   HPI:   Chief Complaint: OBESITY Cathy Vargas is here to discuss her progress with her obesity treatment plan. She is on the Category 3 plan and is following her eating plan approximately 80 to 90 % of the time. She states she is exercising 0 minutes 0 times per week. Damika did very well with weight loss on the Category 3 plan. Her hunger is controlled and she struggled to eat all of her food.  Her weight is 240 lb (108.9 kg) today and has had a weight loss of 8 pounds over a period of 2 weeks since her last visit. She has lost 8 lbs since starting treatment with [place].  Insulin Resistance Blaine has a new diagnosis of insulin resistance based on her elevated fasting insulin level >5.Her A1c was normal, but she has an increased fasting insulin. Although Daveah's blood glucose readings are still under good control, insulin resistance puts her at greater risk of metabolic syndrome and diabetes. She is not taking metformin currently and continues to work on diet and exercise to decrease risk of diabetes. She denies hypoglycemia.  Rash She has a raised erythematous sculptured rash with lesions approximately 0.5cm to 5cm on her legs, arms, and torso. It is very pruritic and worse in the PM. There are no lesions in her fiber webs or wrists. She denies being outdoors or gardening. Her husband does not have any lesions.   ALLERGIES: No Known Allergies  MEDICATIONS: Current Outpatient Medications on File Prior to Visit  Medication Sig Dispense Refill  . Cholecalciferol 4000 units CAPS Take 1 capsule by mouth daily.    [name] levothyroxine (SYNTHROID, LEVOTHROID) 150 MCG tablet 1 TABLET ON AN EMPTY STOMACH IN THE MORNING ONCE A DAY  0  . loratadine (CLARITIN) 10 MG tablet Take 10 mg by mouth daily.    . Melatonin 3 MG TABS Take 1 tablet by mouth at bedtime.    . Norethindrone-Ethinyl Estradiol-Fe Biphas (LO LOESTRIN FE) 1 MG-10 MCG / 10 MCG tablet Take 1 tablet by mouth  daily.    . propranolol (INDERAL) 60 MG tablet Take 60 mg by mouth at bedtime.     No current facility-administered medications on file prior to visit.     PAST MEDICAL HISTORY: Past Medical History:  Diagnosis Date  . Abnormal Pap smear of cervix 2015  . Anxiety   . Back pain   . Gallbladder disease   . Gallbladder problem   . HTN (hypertension)   . Hypothyroidism   . Joint pain   . PCOS (polycystic ovarian syndrome)   . Sciatica   . Thyroid disease   . Vitamin D deficiency     PAST SURGICAL HISTORY: Past Surgical History:  Procedure Laterality Date  . CHOLECYSTECTOMY  2010  . DILATION AND CURETTAGE OF UTERUS  2015  . OOPHORECTOMY    . WISDOM TOOTH EXTRACTION  2001    SOCIAL HISTORY: Social History   Tobacco Use  . Smoking status: Never Smoker  . Smokeless tobacco: Never Used  Substance Use Topics  . Alcohol use: No  . Drug use: No    FAMILY HISTORY: Family History  Problem Relation Age of Onset  . Hypertension Mother   . Obesity Mother   . Hypertension Father   . Alcoholism Father     ROS: Review of Systems  Constitutional: Positive for weight loss.  Skin: Positive for itching.  Endo/Heme/Allergies:       Negative for  hypoglycemia.    PHYSICAL EXAM: Blood pressure 131/86, pulse 84, temperature 98.2 F (36.8 C), temperature source Oral, height 5\' 8"  (1.727 m), weight 240 lb (108.9 kg), SpO2 98 %. Body mass index is 36.49 kg/m. Physical Exam  Constitutional: She is oriented to person, place, and time. She appears well-developed and well-nourished.  Cardiovascular: Normal rate.  Musculoskeletal: Normal range of motion.  Neurological: She is oriented to person, place, and time.  Skin: Skin is warm and dry.  Psychiatric: She has a normal mood and affect. Her behavior is normal.  Vitals reviewed.   RECENT LABS AND TESTS: BMET    Component Value Date/Time   NA 140 01/18/2018 1227   K 4.3 01/18/2018 1227   CL 102 01/18/2018 1227   CO2 22  01/18/2018 1227   GLUCOSE 82 01/18/2018 1227   BUN 11 01/18/2018 1227   CREATININE 0.65 01/18/2018 1227   CALCIUM 9.1 01/18/2018 1227   GFRNONAA 116 01/18/2018 1227   GFRAA 134 01/18/2018 1227   Lab Results  Component Value Date   HGBA1C 5.5 01/18/2018   Lab Results  Component Value Date   INSULIN 22.0 01/18/2018   CBC    Component Value Date/Time   WBC 5.7 01/18/2018 1227   RBC 4.44 01/18/2018 1227   HGB 13.2 01/18/2018 1227   HCT 39.7 01/18/2018 1227   MCV 89 01/18/2018 1227   MCH 29.7 01/18/2018 1227   MCHC 33.2 01/18/2018 1227   RDW 14.0 01/18/2018 1227   LYMPHSABS 1.3 01/18/2018 1227   EOSABS 0.1 01/18/2018 1227   BASOSABS 0.0 01/18/2018 1227   Iron/TIBC/Ferritin/ %Sat No results found for: IRON, TIBC, FERRITIN, IRONPCTSAT Lipid Panel     Component Value Date/Time   CHOL 181 01/18/2018 1227   TRIG 124 01/18/2018 1227   HDL 46 01/18/2018 1227   LDLCALC 110 (H) 01/18/2018 1227   Hepatic Function Panel     Component Value Date/Time   PROT 6.7 01/18/2018 1227   ALBUMIN 4.2 01/18/2018 1227   AST 14 01/18/2018 1227   ALT 15 01/18/2018 1227   ALKPHOS 52 01/18/2018 1227   BILITOT 0.2 01/18/2018 1227      Component Value Date/Time   TSH 2.260 01/18/2018 1227   Results for PERRIE, RAGIN (MRN [id number]) as of 02/06/2018 08:47  Ref. Range 01/18/2018 12:27  Vitamin D, 25-Hydroxy Latest Ref Range: 30.0 - 100.0 ng/mL 56.8   ASSESSMENT AND PLAN: Insulin resistance  Rash  Class 2 severe obesity with serious comorbidity and body mass index (BMI) of 36.0 to 36.9 in adult, unspecified obesity type (HCC)  PLAN:  Insulin Resistance Daianna will continue to work on weight loss, exercise, and decreasing simple carbohydrates in her diet to help decrease the risk of diabetes. We discussed metformin including benefits and risks. She was informed that eating too many simple carbohydrates or too many calories at one sitting increases the likelihood of GI side effects. We are  going to defer metformin for now and prescription was not written today. She agrees to continue diet and exercise and will recheck labs in 3 months. Krystyna agreed to follow up with [place] as directed to monitor her progress in 2 weeks.  Rash Lyndall agrees to start OTC Zyrtec every AM and benadryl every AM, cool showers and moisturizing BID. She will schedule a dermatology appointment in case symptoms fail to improve.  I spent > than 50% of the 25 minute visit on counseling as documented in the note.  Obesity Toby is currently  in the action stage of change. As such, her goal is to continue with weight loss efforts. She has agreed to follow the Category 3 plan. Georgie has been instructed to work up to a goal of 150 minutes of combined cardio and strengthening exercise per week for weight loss and overall health benefits. We discussed the following Behavioral Modification Strategies today: increasing lean protein intake, decreasing simple carbohydrates, and work on meal planning and easy cooking plans.  Mizani has agreed to follow up with our clinic in 2 weeks. She was informed of the importance of frequent follow up visits to maximize her success with intensive lifestyle modifications for her multiple health conditions.   OBESITY BEHAVIORAL INTERVENTION VISIT  Today's visit was # 2  Starting weight: 248 lbs Starting date: 01/18/18 Today's weight : Weight: 240 lb (108.9 kg)  Today's date: 02/02/2018 Total lbs lost to date: 8  ASK: We discussed the diagnosis of obesity with Marliss Czar today and Tayva agreed to give [place] permission to discuss obesity behavioral modification therapy today.  ASSESS: Donzella has the diagnosis of obesity and her BMI today is 36.5. Aaryanna is in the action stage of change.   ADVISE: Katalyna was educated on the multiple health risks of obesity as well as the benefit of weight loss to improve her health. She was advised of the need for long term treatment and the importance of  lifestyle modifications to improve her current health and to decrease her risk of future health problems.  AGREE: Multiple dietary modification options and treatment options were discussed and Latera agreed to follow the recommendations documented in the above note.  ARRANGE: Kayona was educated on the importance of frequent visits to treat obesity as outlined per CMS and USPSTF guidelines and agreed to schedule her next follow up appointment today.  I, Kirke Corin, am acting as transcriptionist for Wilder Glade, MD  I have reviewed the above documentation for accuracy and completeness, and I agree with the above. -Quillian Quince, MD

## 2018-02-09 ENCOUNTER — Ambulatory Visit (INDEPENDENT_AMBULATORY_CARE_PROVIDER_SITE_OTHER): Payer: Self-pay | Admitting: Psychology

## 2018-02-20 ENCOUNTER — Ambulatory Visit (INDEPENDENT_AMBULATORY_CARE_PROVIDER_SITE_OTHER): Payer: Self-pay | Admitting: Family Medicine

## 2018-02-20 ENCOUNTER — Encounter (INDEPENDENT_AMBULATORY_CARE_PROVIDER_SITE_OTHER): Payer: Self-pay

## 2018-02-22 ENCOUNTER — Ambulatory Visit (INDEPENDENT_AMBULATORY_CARE_PROVIDER_SITE_OTHER): Payer: 59 | Admitting: Family Medicine

## 2018-02-22 VITALS — BP 127/73 | HR 70 | Temp 98.3°F | Ht 68.0 in | Wt 239.0 lb

## 2018-02-22 DIAGNOSIS — Z6836 Body mass index (BMI) 36.0-36.9, adult: Secondary | ICD-10-CM | POA: Diagnosis not present

## 2018-02-22 DIAGNOSIS — E8881 Metabolic syndrome: Secondary | ICD-10-CM | POA: Diagnosis not present

## 2018-02-22 DIAGNOSIS — E88819 Insulin resistance, unspecified: Secondary | ICD-10-CM

## 2018-02-22 DIAGNOSIS — E66812 Obesity, class 2: Secondary | ICD-10-CM

## 2018-02-23 NOTE — Progress Notes (Signed)
Office: [phone]  /  Fax: [phone]   HPI:   Chief Complaint: OBESITY Cathy Vargas is here to discuss her progress with her obesity treatment plan. She is on the Category 3 plan and is following her eating plan approximately 25 % of the time. She states she is walking for 30-45 minutes 2 times per week. Cathy Vargas has had increased challenges with weight loss with increased celebration eating. She will be going out of town for a wedding and would like to discuss travel and celebration strategies.  Her weight is 239 lb (108.4 kg) today and has had a weight loss of 1 pound over a period of 3 weeks since her last visit. She has lost 9 lbs since starting treatment with [place].  Insulin Resistance Cathy Vargas has a diagnosis of insulin resistance based on her elevated fasting insulin level >5. Although Cathy Vargas's blood glucose readings are still under good control, insulin resistance puts her at greater risk of metabolic syndrome and diabetes. She is not taking metformin currently, she is doing well on diet prescription, and she notes decreased polyphagia when she follows the Category 3 plan closely. She continues to work on diet and exercise to decrease risk of diabetes. She denies hypoglycemia.  ALLERGIES: No Known Allergies  MEDICATIONS: Current Outpatient Medications on File Prior to Visit  Medication Sig Dispense Refill  . Cholecalciferol 4000 units CAPS Take 1 capsule by mouth daily.    [name] levothyroxine (SYNTHROID, LEVOTHROID) 150 MCG tablet 1 TABLET ON AN EMPTY STOMACH IN THE MORNING ONCE A DAY  0  . loratadine (CLARITIN) 10 MG tablet Take 10 mg by mouth daily.    . Melatonin 3 MG TABS Take 1 tablet by mouth at bedtime.    . Norethindrone-Ethinyl Estradiol-Fe Biphas (LO LOESTRIN FE) 1 MG-10 MCG / 10 MCG tablet Take 1 tablet by mouth daily.    . propranolol (INDERAL) 60 MG tablet Take 60 mg by mouth at bedtime.     No current facility-administered medications on file prior to visit.     PAST MEDICAL  HISTORY: Past Medical History:  Diagnosis Date  . Abnormal Pap smear of cervix 2015  . Anxiety   . Back pain   . Gallbladder disease   . Gallbladder problem   . HTN (hypertension)   . Hypothyroidism   . Joint pain   . PCOS (polycystic ovarian syndrome)   . Sciatica   . Thyroid disease   . Vitamin D deficiency     PAST SURGICAL HISTORY: Past Surgical History:  Procedure Laterality Date  . CHOLECYSTECTOMY  2010  . DILATION AND CURETTAGE OF UTERUS  2015  . OOPHORECTOMY    . WISDOM TOOTH EXTRACTION  2001    SOCIAL HISTORY: Social History   Tobacco Use  . Smoking status: Never Smoker  . Smokeless tobacco: Never Used  Substance Use Topics  . Alcohol use: No  . Drug use: No    FAMILY HISTORY: Family History  Problem Relation Age of Onset  . Hypertension Mother   . Obesity Mother   . Hypertension Father   . Alcoholism Father     ROS: Review of Systems  Constitutional: Positive for weight loss.  Endo/Heme/Allergies:       Positive polyphagia Negative hypoglycemia    PHYSICAL EXAM: Blood pressure 127/73, pulse 70, temperature 98.3 F (36.8 C), temperature source Oral, height 5\' 8"  (1.727 m), weight 239 lb (108.4 kg), SpO2 98 %. Body mass index is 36.34 kg/m. Physical Exam  Constitutional: She is  oriented to person, place, and time. She appears well-developed and well-nourished.  Cardiovascular: Normal rate.  Pulmonary/Chest: Effort normal.  Musculoskeletal: Normal range of motion.  Neurological: She is oriented to person, place, and time.  Skin: Skin is warm and dry.  Psychiatric: She has a normal mood and affect. Her behavior is normal.  Vitals reviewed.   RECENT LABS AND TESTS: BMET    Component Value Date/Time   NA 140 01/18/2018 1227   K 4.3 01/18/2018 1227   CL 102 01/18/2018 1227   CO2 22 01/18/2018 1227   GLUCOSE 82 01/18/2018 1227   BUN 11 01/18/2018 1227   CREATININE 0.65 01/18/2018 1227   CALCIUM 9.1 01/18/2018 1227   GFRNONAA 116  01/18/2018 1227   GFRAA 134 01/18/2018 1227   Lab Results  Component Value Date   HGBA1C 5.5 01/18/2018   Lab Results  Component Value Date   INSULIN 22.0 01/18/2018   CBC    Component Value Date/Time   WBC 5.7 01/18/2018 1227   RBC 4.44 01/18/2018 1227   HGB 13.2 01/18/2018 1227   HCT 39.7 01/18/2018 1227   MCV 89 01/18/2018 1227   MCH 29.7 01/18/2018 1227   MCHC 33.2 01/18/2018 1227   RDW 14.0 01/18/2018 1227   LYMPHSABS 1.3 01/18/2018 1227   EOSABS 0.1 01/18/2018 1227   BASOSABS 0.0 01/18/2018 1227   Iron/TIBC/Ferritin/ %Sat No results found for: IRON, TIBC, FERRITIN, IRONPCTSAT Lipid Panel     Component Value Date/Time   CHOL 181 01/18/2018 1227   TRIG 124 01/18/2018 1227   HDL 46 01/18/2018 1227   LDLCALC 110 (H) 01/18/2018 1227   Hepatic Function Panel     Component Value Date/Time   PROT 6.7 01/18/2018 1227   ALBUMIN 4.2 01/18/2018 1227   AST 14 01/18/2018 1227   ALT 15 01/18/2018 1227   ALKPHOS 52 01/18/2018 1227   BILITOT 0.2 01/18/2018 1227      Component Value Date/Time   TSH 2.260 01/18/2018 1227    ASSESSMENT AND PLAN: Insulin resistance  Class 2 severe obesity with serious comorbidity and body mass index (BMI) of 36.0 to 36.9 in adult, unspecified obesity type (HCC)  PLAN:  Insulin Resistance Cathy Vargas will continue to work on weight loss, diet, exercise, and decreasing simple carbohydrates in her diet to help decrease the risk of diabetes. We dicussed metformin including benefits and risks. She was informed that eating too many simple carbohydrates or too many calories at one sitting increases the likelihood of GI side effects. Cathy Vargas declined metformin for now and prescription was not written today. We will recheck labs in 6 weeks. Cathy Vargas agrees to follow up with our clinic in 2 to 3 weeks as directed to monitor her progress.  I spent > than 50% of the 15 minute visit on counseling as documented in the note.  Obesity Cathy Vargas is currently in the  action stage of change. As such, her goal is to continue with weight loss efforts She has agreed to follow the Category 3 plan Cathy Vargas has been instructed to work up to a goal of 150 minutes of combined cardio and strengthening exercise per week for weight loss and overall health benefits. We discussed the following Behavioral Modification Strategies today: increasing lean protein intake, travel eating strategies, and celebration eating strategies   Kiaja has agreed to follow up with our clinic in 2 to 3 weeks. She was informed of the importance of frequent follow up visits to maximize her success with intensive lifestyle modifications  for her multiple health conditions.   OBESITY BEHAVIORAL INTERVENTION VISIT  Today's visit was # 3   Starting weight: 248 lbs Starting date: 01/18/18 Today's weight : 239 lbs Today's date: 02/22/2018 Total lbs lost to date: 9    ASK: We discussed the diagnosis of obesity with Marliss Czar today and Rihanna agreed to give [place] permission to discuss obesity behavioral modification therapy today.  ASSESS: Wyvonne has the diagnosis of obesity and her BMI today is 36.35 Epifania is in the action stage of change   ADVISE: Rowen was educated on the multiple health risks of obesity as well as the benefit of weight loss to improve her health. She was advised of the need for long term treatment and the importance of lifestyle modifications to improve her current health and to decrease her risk of future health problems.  AGREE: Multiple dietary modification options and treatment options were discussed and  Taleigha agreed to follow the recommendations documented in the above note.  ARRANGE: Ama was educated on the importance of frequent visits to treat obesity as outlined per CMS and USPSTF guidelines and agreed to schedule her next follow up appointment today.  I, Burt Knack, am acting as transcriptionist for Quillian Quince, MD  I have reviewed the above documentation for  accuracy and completeness, and I agree with the above. -Quillian Quince, MD

## 2018-03-13 ENCOUNTER — Ambulatory Visit (INDEPENDENT_AMBULATORY_CARE_PROVIDER_SITE_OTHER): Payer: 59 | Admitting: Family Medicine

## 2018-03-13 VITALS — BP 120/79 | HR 67 | Temp 98.2°F | Ht 68.0 in | Wt 237.0 lb

## 2018-03-13 DIAGNOSIS — E038 Other specified hypothyroidism: Secondary | ICD-10-CM

## 2018-03-13 DIAGNOSIS — Z6836 Body mass index (BMI) 36.0-36.9, adult: Secondary | ICD-10-CM

## 2018-03-14 ENCOUNTER — Encounter (INDEPENDENT_AMBULATORY_CARE_PROVIDER_SITE_OTHER): Payer: Self-pay | Admitting: Family Medicine

## 2018-03-14 NOTE — Progress Notes (Signed)
Office: [phone]  /  Fax: [phone]   HPI:   Chief Complaint: OBESITY Cathy Vargas is here to discuss her progress with her obesity treatment plan. She is on the Category 3 plan and is following her eating plan approximately 60-70 % of the time. She states she is exercising 0 minutes 0 times per week. Cathy Vargas continues to do very well with weight loss but is not always eating all of her food on the plan. She is getting bored and would like more options.  Her weight is 237 lb (107.5 kg) today and has had a weight loss of 2 pounds over a period of 2 to 3 weeks since her last visit. She has lost 11 lbs since starting treatment with [place].  Hypothyroidism Cathy Vargas has a diagnosis of hypothyroidism. She is on levothyroxine, her blood pressure is stable and she denies hot or cold intolerance or palpitations. She is hoping to be able to decrease her dose with weight loss.  ALLERGIES: No Known Allergies  MEDICATIONS: Current Outpatient Medications on File Prior to Visit  Medication Sig Dispense Refill  . Cholecalciferol 4000 units CAPS Take 1 capsule by mouth daily.    [name] levothyroxine (SYNTHROID, LEVOTHROID) 150 MCG tablet 1 TABLET ON AN EMPTY STOMACH IN THE MORNING ONCE A DAY  0  . loratadine (CLARITIN) 10 MG tablet Take 10 mg by mouth daily.    . Melatonin 3 MG TABS Take 1 tablet by mouth at bedtime.    . Norethindrone-Ethinyl Estradiol-Fe Biphas (LO LOESTRIN FE) 1 MG-10 MCG / 10 MCG tablet Take 1 tablet by mouth daily.    . propranolol (INDERAL) 60 MG tablet Take 60 mg by mouth at bedtime.     No current facility-administered medications on file prior to visit.     PAST MEDICAL HISTORY: Past Medical History:  Diagnosis Date  . Abnormal Pap smear of cervix 2015  . Anxiety   . Back pain   . Gallbladder disease   . Gallbladder problem   . HTN (hypertension)   . Hypothyroidism   . Joint pain   . PCOS (polycystic ovarian syndrome)   . Sciatica   . Thyroid disease   . Vitamin D deficiency       PAST SURGICAL HISTORY: Past Surgical History:  Procedure Laterality Date  . CHOLECYSTECTOMY  2010  . DILATION AND CURETTAGE OF UTERUS  2015  . OOPHORECTOMY    . WISDOM TOOTH EXTRACTION  2001    SOCIAL HISTORY: Social History   Tobacco Use  . Smoking status: Never Smoker  . Smokeless tobacco: Never Used  Substance Use Topics  . Alcohol use: No  . Drug use: No    FAMILY HISTORY: Family History  Problem Relation Age of Onset  . Hypertension Mother   . Obesity Mother   . Hypertension Father   . Alcoholism Father     ROS: Review of Systems  Constitutional: Positive for weight loss.  Cardiovascular: Negative for palpitations.  Endo/Heme/Allergies:       Negative hot/cold intolerance    PHYSICAL EXAM: Blood pressure 120/79, pulse 67, temperature 98.2 F (36.8 C), temperature source Oral, height 5\' 8"  (1.727 m), weight 237 lb (107.5 kg), SpO2 98 %. Body mass index is 36.04 kg/m. Physical Exam  Constitutional: She is oriented to person, place, and time. She appears well-developed and well-nourished.  Cardiovascular: Normal rate.  Pulmonary/Chest: Effort normal.  Musculoskeletal: Normal range of motion.  Neurological: She is oriented to person, place, and time.  Skin: Skin  is warm and dry.  Psychiatric: She has a normal mood and affect. Her behavior is normal.  Vitals reviewed.   RECENT LABS AND TESTS: BMET    Component Value Date/Time   NA 140 01/18/2018 1227   K 4.3 01/18/2018 1227   CL 102 01/18/2018 1227   CO2 22 01/18/2018 1227   GLUCOSE 82 01/18/2018 1227   BUN 11 01/18/2018 1227   CREATININE 0.65 01/18/2018 1227   CALCIUM 9.1 01/18/2018 1227   GFRNONAA 116 01/18/2018 1227   GFRAA 134 01/18/2018 1227   Lab Results  Component Value Date   HGBA1C 5.5 01/18/2018   Lab Results  Component Value Date   INSULIN 22.0 01/18/2018   CBC    Component Value Date/Time   WBC 5.7 01/18/2018 1227   RBC 4.44 01/18/2018 1227   HGB 13.2 01/18/2018 1227    HCT 39.7 01/18/2018 1227   MCV 89 01/18/2018 1227   MCH 29.7 01/18/2018 1227   MCHC 33.2 01/18/2018 1227   RDW 14.0 01/18/2018 1227   LYMPHSABS 1.3 01/18/2018 1227   EOSABS 0.1 01/18/2018 1227   BASOSABS 0.0 01/18/2018 1227   Iron/TIBC/Ferritin/ %Sat No results found for: IRON, TIBC, FERRITIN, IRONPCTSAT Lipid Panel     Component Value Date/Time   CHOL 181 01/18/2018 1227   TRIG 124 01/18/2018 1227   HDL 46 01/18/2018 1227   LDLCALC 110 (H) 01/18/2018 1227   Hepatic Function Panel     Component Value Date/Time   PROT 6.7 01/18/2018 1227   ALBUMIN 4.2 01/18/2018 1227   AST 14 01/18/2018 1227   ALT 15 01/18/2018 1227   ALKPHOS 52 01/18/2018 1227   BILITOT 0.2 01/18/2018 1227      Component Value Date/Time   TSH 2.260 01/18/2018 1227    ASSESSMENT AND PLAN: Other specified hypothyroidism  Class 2 severe obesity with serious comorbidity and body mass index (BMI) of 36.0 to 36.9 in adult, unspecified obesity type (HCC)  PLAN:  Hypothyroidism Cathy Vargas was informed of the importance of good thyroid control to help with weight loss efforts. She was also informed that supertheraputic thyroid levels are dangerous and will not improve weight loss results. Cathy Vargas was advised that her levothyroxine dose may not decrease with weight loss, but we will recheck her labs in 1 month. Cathy Vargas agrees to follow up with our clinic in 2 weeks.  I spent > than 50% of the 15 minute visit on counseling as documented in the note.  Obesity Cathy Vargas is currently in the action stage of change. As such, her goal is to continue with weight loss efforts She has agreed to change to keep a food journal with 1200-1500 calories and 80+ grams of protein daily Cathy Vargas has been instructed to work up to a goal of 150 minutes of combined cardio and strengthening exercise per week for weight loss and overall health benefits. We discussed the following Behavioral Modification Strategies today: increasing lean protein  intake, decreasing simple carbohydrates, work on meal planning and easy cooking plans, and keep a strict food journal   Cathy Vargas has agreed to follow up with our clinic in 2 weeks. She was informed of the importance of frequent follow up visits to maximize her success with intensive lifestyle modifications for her multiple health conditions.   OBESITY BEHAVIORAL INTERVENTION VISIT  Today's visit was # 4   Starting weight: 248 lbs Starting date: 01/18/18 Today's weight : 237 lbs  Today's date: 03/13/2018 Total lbs lost to date: 11    ASK:  We discussed the diagnosis of obesity with Cathy Vargas today and Cathy Vargas agreed to give [place] permission to discuss obesity behavioral modification therapy today.  ASSESS: Cathy Vargas has the diagnosis of obesity and her BMI today is 36.04 Cathy Vargas is in the action stage of change   ADVISE: Cathy Vargas was educated on the multiple health risks of obesity as well as the benefit of weight loss to improve her health. She was advised of the need for long term treatment and the importance of lifestyle modifications to improve her current health and to decrease her risk of future health problems.  AGREE: Multiple dietary modification options and treatment options were discussed and  Cathy Vargas agreed to follow the recommendations documented in the above note.  ARRANGE: Cathy Vargas was educated on the importance of frequent visits to treat obesity as outlined per CMS and USPSTF guidelines and agreed to schedule her next follow up appointment today.  I, Burt Knack, am acting as transcriptionist for Quillian Quince, MD  I have reviewed the above documentation for accuracy and completeness, and I agree with the above. -Quillian Quince, MD

## 2018-03-29 ENCOUNTER — Ambulatory Visit (INDEPENDENT_AMBULATORY_CARE_PROVIDER_SITE_OTHER): Payer: 59 | Admitting: Family Medicine

## 2018-03-29 ENCOUNTER — Encounter (INDEPENDENT_AMBULATORY_CARE_PROVIDER_SITE_OTHER): Payer: Self-pay | Admitting: Family Medicine

## 2018-03-29 VITALS — BP 140/86 | HR 71 | Temp 98.0°F | Ht 68.0 in | Wt 233.0 lb

## 2018-03-29 DIAGNOSIS — Z6835 Body mass index (BMI) 35.0-35.9, adult: Secondary | ICD-10-CM

## 2018-03-29 DIAGNOSIS — Z9189 Other specified personal risk factors, not elsewhere classified: Secondary | ICD-10-CM

## 2018-03-29 DIAGNOSIS — I10 Essential (primary) hypertension: Secondary | ICD-10-CM

## 2018-03-30 NOTE — Progress Notes (Signed)
Office: [phone]  /  Fax: [phone]   HPI:   Chief Complaint: OBESITY Cathy Vargas is here to discuss her progress with her obesity treatment plan. She is keeping a food journal with 1200 to 1500 calories and 80 grams of protein and is following her eating plan approximately 75 % of the time. She states she is exercising 0 minutes 0 times per week. Cassandra continues to do well with weight loss. She is journaling most days and working on increasing vegetables and lean protein. She has company coming for Thanksgiving and would like tips on how to do better this year.  Her weight is 233 lb (105.7 kg) today and has had a weight loss of 4 pounds over a period of 2 weeks since her last visit. She has lost 15 lbs since starting treatment with us.  Hypertension Cathy Vargas is a 32 y.o. female with hypertension. She is working on weight loss to help control her blood pressure with the goal of decreasing her risk of heart attack and stroke. Cathy Vargas's blood pressure is elevated today. She is taking propanolol 60mg  and it is usually well controlled. Cathy Vargas denies chest pain.  At risk for cardiovascular disease Cathy Vargas is at a higher than average risk for cardiovascular disease due to hypertension and obesity. She currently denies any chest pain.  ALLERGIES: No Known Allergies  MEDICATIONS: Current Outpatient Medications on File Prior to Visit  Medication Sig Dispense Refill  . Cholecalciferol 4000 units CAPS Take 1 capsule by mouth daily.    [name]. levothyroxine (SYNTHROID, LEVOTHROID) 150 MCG tablet 1 TABLET ON AN EMPTY STOMACH IN THE MORNING ONCE A DAY  0  . loratadine (CLARITIN) 10 MG tablet Take 10 mg by mouth daily.    . Melatonin 3 MG TABS Take 1 tablet by mouth at bedtime.    . Norethindrone-Ethinyl Estradiol-Fe Biphas (LO LOESTRIN FE) 1 MG-10 MCG / 10 MCG tablet Take 1 tablet by mouth daily.    . propranolol (INDERAL) 60 MG tablet Take 60 mg by mouth at bedtime.     No current facility-administered  medications on file prior to visit.     PAST MEDICAL HISTORY: Past Medical History:  Diagnosis Date  . Abnormal Pap smear of cervix 2015  . Anxiety   . Back pain   . Gallbladder disease   . Gallbladder problem   . HTN (hypertension)   . Hypothyroidism   . Joint pain   . PCOS (polycystic ovarian syndrome)   . Sciatica   . Thyroid disease   . Vitamin D deficiency     PAST SURGICAL HISTORY: Past Surgical History:  Procedure Laterality Date  . CHOLECYSTECTOMY  2010  . DILATION AND CURETTAGE OF UTERUS  2015  . OOPHORECTOMY    . WISDOM TOOTH EXTRACTION  2001    SOCIAL HISTORY: Social History   Tobacco Use  . Smoking status: Never Smoker  . Smokeless tobacco: Never Used  Substance Use Topics  . Alcohol use: No  . Drug use: No    FAMILY HISTORY: Family History  Problem Relation Age of Onset  . Hypertension Mother   . Obesity Mother   . Hypertension Father   . Alcoholism Father     ROS: Review of Systems  Constitutional: Positive for weight loss.  Cardiovascular: Negative for chest pain.    PHYSICAL EXAM: Blood pressure 140/86, pulse 71, temperature 98 F (36.7 C), temperature source Oral, height 5\' 8"  (1.727 m), weight 233 lb (105.7 kg), SpO2 100 %.  Body mass index is 35.43 kg/m. Physical Exam  Constitutional: She is oriented to person, place, and time. She appears well-developed and well-nourished.  Cardiovascular: Normal rate.  Pulmonary/Chest: Effort normal.  Musculoskeletal: Normal range of motion.  Neurological: She is oriented to person, place, and time.  Skin: Skin is warm and dry.  Psychiatric: She has a normal mood and affect. Her behavior is normal.  Vitals reviewed.   RECENT LABS AND TESTS: BMET    Component Value Date/Time   NA 140 01/18/2018 1227   K 4.3 01/18/2018 1227   CL 102 01/18/2018 1227   CO2 22 01/18/2018 1227   GLUCOSE 82 01/18/2018 1227   BUN 11 01/18/2018 1227   CREATININE 0.65 01/18/2018 1227   CALCIUM 9.1 01/18/2018  1227   GFRNONAA 116 01/18/2018 1227   GFRAA 134 01/18/2018 1227   Lab Results  Component Value Date   HGBA1C 5.5 01/18/2018   Lab Results  Component Value Date   INSULIN 22.0 01/18/2018   CBC    Component Value Date/Time   WBC 5.7 01/18/2018 1227   RBC 4.44 01/18/2018 1227   HGB 13.2 01/18/2018 1227   HCT 39.7 01/18/2018 1227   MCV 89 01/18/2018 1227   MCH 29.7 01/18/2018 1227   MCHC 33.2 01/18/2018 1227   RDW 14.0 01/18/2018 1227   LYMPHSABS 1.3 01/18/2018 1227   EOSABS 0.1 01/18/2018 1227   BASOSABS 0.0 01/18/2018 1227   Iron/TIBC/Ferritin/ %Sat No results found for: IRON, TIBC, FERRITIN, IRONPCTSAT Lipid Panel     Component Value Date/Time   CHOL 181 01/18/2018 1227   TRIG 124 01/18/2018 1227   HDL 46 01/18/2018 1227   LDLCALC 110 (H) 01/18/2018 1227   Hepatic Function Panel     Component Value Date/Time   PROT 6.7 01/18/2018 1227   ALBUMIN 4.2 01/18/2018 1227   AST 14 01/18/2018 1227   ALT 15 01/18/2018 1227   ALKPHOS 52 01/18/2018 1227   BILITOT 0.2 01/18/2018 1227      Component Value Date/Time   TSH 2.260 01/18/2018 1227   Results for TERITA, HEJL (MRN [id number]) as of 03/30/2018 06:15  Ref. Range 01/18/2018 12:27  Vitamin D, 25-Hydroxy Latest Ref Range: 30.0 - 100.0 ng/mL 56.8   ASSESSMENT AND PLAN: Essential hypertension  At risk for heart disease  Class 2 severe obesity with serious comorbidity and body mass index (BMI) of 35.0 to 35.9 in adult, unspecified obesity type (HCC)  PLAN:  Hypertension We discussed sodium restriction, working on healthy weight loss, and a regular exercise program as the means to achieve improved blood pressure control. We will continue to monitor her blood pressure as well as her progress with the above lifestyle modifications. She will continue her medications as prescribed and will watch for signs of hypotension as she continues her lifestyle modifications. She agrees to continue with her medications and weight  loss. We will recheck her blood pressure in 3 weeks. Cathy Vargas agreed with this plan and agreed to follow up as directed.  Cardiovascular risk counseling Cathy Vargas was given extended (15 minutes) coronary artery disease prevention counseling today. She is 32 y.o. female and has risk factors for heart disease including hypertension and obesity. We discussed intensive lifestyle modifications today with an emphasis on specific weight loss instructions and strategies. Pt was also informed of the importance of increasing exercise and decreasing saturated fats to help prevent heart disease.  Obesity Cathy Vargas is currently in the action stage of change. As such, her goal is to  continue with weight loss efforts. She has agreed to keep a food journal with 1200 to 1500 calories and 80+ grams of protein.  Cathy Vargas has been instructed to work up to a goal of 150 minutes of combined cardio and strengthening exercise per week for weight loss and overall health benefits. We discussed the following Behavioral Modification Strategies today: increasing lean protein intake, decreasing simple carbohydrates, and work on meal planning and easy cooking plans.   Cathy Vargas has agreed to follow up with our clinic in 3 weeks. She was informed of the importance of frequent follow up visits to maximize her success with intensive lifestyle modifications for her multiple health conditions.   OBESITY BEHAVIORAL INTERVENTION VISIT  Today's visit was # 5   Starting weight: 248 lbs Starting date: 01/18/18 Today's weight : Weight: 233 lb (105.7 kg)  Today's date: 03/29/2018 Total lbs lost to date: 15  ASK: We discussed the diagnosis of obesity with Cathy Vargas today and Cathy Vargas agreed to give [place] permission to discuss obesity behavioral modification therapy today.  ASSESS: Cathy Vargas has the diagnosis of obesity and her BMI today is 35.44. Cathy Vargas is in the action stage of change.   ADVISE: Cathy Vargas was educated on the multiple health risks of obesity as  well as the benefit of weight loss to improve her health. She was advised of the need for long term treatment and the importance of lifestyle modifications to improve her current health and to decrease her risk of future health problems.  AGREE: Multiple dietary modification options and treatment options were discussed and Cathy Vargas agreed to follow the recommendations documented in the above note.  ARRANGE: Cathy Vargas was educated on the importance of frequent visits to treat obesity as outlined per CMS and USPSTF guidelines and agreed to schedule her next follow up appointment today.  I, Kirke Corin, am acting as transcriptionist for Wilder Glade, MD  I have reviewed the above documentation for accuracy and completeness, and I agree with the above. -Quillian Quince, MD

## 2018-04-04 DIAGNOSIS — R0981 Nasal congestion: Secondary | ICD-10-CM | POA: Diagnosis not present

## 2018-04-04 DIAGNOSIS — R05 Cough: Secondary | ICD-10-CM | POA: Diagnosis not present

## 2018-04-04 DIAGNOSIS — J3489 Other specified disorders of nose and nasal sinuses: Secondary | ICD-10-CM | POA: Diagnosis not present

## 2018-04-11 ENCOUNTER — Encounter (INDEPENDENT_AMBULATORY_CARE_PROVIDER_SITE_OTHER): Payer: Self-pay | Admitting: Family Medicine

## 2018-04-24 ENCOUNTER — Ambulatory Visit (INDEPENDENT_AMBULATORY_CARE_PROVIDER_SITE_OTHER): Payer: 59 | Admitting: Family Medicine

## 2018-04-24 ENCOUNTER — Encounter (INDEPENDENT_AMBULATORY_CARE_PROVIDER_SITE_OTHER): Payer: Self-pay

## 2018-04-24 ENCOUNTER — Encounter (INDEPENDENT_AMBULATORY_CARE_PROVIDER_SITE_OTHER): Payer: Self-pay | Admitting: Family Medicine

## 2018-04-24 VITALS — BP 127/79 | HR 69 | Temp 98.0°F | Ht 68.0 in | Wt 230.0 lb

## 2018-04-24 DIAGNOSIS — I10 Essential (primary) hypertension: Secondary | ICD-10-CM | POA: Diagnosis not present

## 2018-04-24 DIAGNOSIS — Z6835 Body mass index (BMI) 35.0-35.9, adult: Secondary | ICD-10-CM

## 2018-04-26 NOTE — Progress Notes (Signed)
Office: [phone]  /  Fax: [phone]   HPI:   Chief Complaint: OBESITY Cathy Vargas is here to discuss her progress with her obesity treatment plan. She is keeping a food journal with 1200 to 1500 calories and 80+ grams of protein and is following her eating plan approximately 70 % of the time. She states she is exercising 0 minutes 0 times per week. Walker continues to do well with weight loss. She is journaling on and off, but working on increasing lean protein. She is getting good support from her husband.  Her weight is 230 lb (104.3 kg) today and has had a weight loss of 3 pounds over a period of 4 weeks since her last visit. She has lost 3 lbs since starting treatment with [place].  Hypertension Linde Cathy Vargas is a 32 y.o. female with hypertension. Nathan's blood pressure is stable on propanolol 60mg  and with her diet and weight loss. She is working weight loss to help control her blood pressure with the goal of decreasing her risk of heart attack and stroke. Cathleen denies chest pain, headache, or lightheadedness.  ASSESSMENT AND PLAN:  Essential hypertension  Class 2 severe obesity with serious comorbidity and body mass index (BMI) of 35.0 to 35.9 in adult, unspecified obesity type (HCC)  PLAN:  Hypertension We discussed sodium restriction, working on healthy weight loss, and a regular exercise program as the means to achieve improved blood pressure control. We will continue to monitor her blood pressure as well as her progress with the above lifestyle modifications. She will continue with her diet, exercise, and propanolol as prescribed. Her goal is to continue with decreasing medications and will watch for signs of hypotension as she continues her lifestyle modifications. [name] agreed with this plan and agreed to follow up as directed in 3 to 4 weeks.  I spent > than 50% of the 15 minute visit on counseling as documented in the note.  Obesity Langston is currently in the action stage of  change. As such, her goal is to continue with weight loss efforts. She has agreed to keep a food journal with 1200 to 1500 calories and 80+ grams of protein.  [name] has been instructed to work up to a goal of 150 minutes of combined cardio and strengthening exercise per week for weight loss and overall health benefits. We discussed the following Behavioral Modification Strategies today: increasing lean protein intake, decreasing simple carbohydrates, work on meal planning and easy cooking plans, and holiday eating strategies.   [name] has agreed to follow up with our clinic in 3 to 4 weeks. She was informed of the importance of frequent follow up visits to maximize her success with intensive lifestyle modifications for her multiple health conditions.  ALLERGIES: No Known Allergies  MEDICATIONS: Current Outpatient Medications on File Prior to Visit  Medication Sig Dispense Refill  . Cholecalciferol 4000 units CAPS Take 1 capsule by mouth daily.    [name] levothyroxine (SYNTHROID, LEVOTHROID) 150 MCG tablet 1 TABLET ON AN EMPTY STOMACH IN THE MORNING ONCE A DAY  0  . loratadine (CLARITIN) 10 MG tablet Take 10 mg by mouth daily.    . Melatonin 3 MG TABS Take 1 tablet by mouth at bedtime.    . Norethindrone-Ethinyl Estradiol-Fe Biphas (LO LOESTRIN FE) 1 MG-10 MCG / 10 MCG tablet Take 1 tablet by mouth daily.    . propranolol (INDERAL) 60 MG tablet Take 60 mg by mouth at bedtime.     No current facility-administered medications  on file prior to visit.     PAST MEDICAL HISTORY: Past Medical History:  Diagnosis Date  . Abnormal Pap smear of cervix 2015  . Anxiety   . Back pain   . Gallbladder disease   . Gallbladder problem   . HTN (hypertension)   . Hypothyroidism   . Joint pain   . PCOS (polycystic ovarian syndrome)   . Sciatica   . Thyroid disease   . Vitamin D deficiency     PAST SURGICAL HISTORY: Past Surgical History:  Procedure Laterality Date  . CHOLECYSTECTOMY  2010  . DILATION  AND CURETTAGE OF UTERUS  2015  . OOPHORECTOMY    . WISDOM TOOTH EXTRACTION  2001    SOCIAL HISTORY: Social History   Tobacco Use  . Smoking status: Never Smoker  . Smokeless tobacco: Never Used  Substance Use Topics  . Alcohol use: No  . Drug use: No    FAMILY HISTORY: Family History  Problem Relation Age of Onset  . Hypertension Mother   . Obesity Mother   . Hypertension Father   . Alcoholism Father     ROS: Review of Systems  Constitutional: Positive for weight loss.  Cardiovascular: Negative for chest pain.  Neurological: Negative for headaches.       Negative for lightheadedness.   PHYSICAL EXAM: Blood pressure 127/79, pulse 69, temperature 98 F (36.7 C), temperature source Oral, height 5\' 8"  (1.727 m), weight 230 lb (104.3 kg), SpO2 98 %. Body mass index is 34.97 kg/m. Physical Exam Vitals signs reviewed.  Constitutional:      Appearance: Normal appearance. She is obese.  Cardiovascular:     Rate and Rhythm: Normal rate.  Pulmonary:     Effort: Pulmonary effort is normal.  Musculoskeletal: Normal range of motion.  Skin:    General: Skin is warm and dry.  Neurological:     Mental Status: She is alert and oriented to person, place, and time.  Psychiatric:        Mood and Affect: Mood normal.        Behavior: Behavior normal.    RECENT LABS AND TESTS: BMET    Component Value Date/Time   NA 140 01/18/2018 1227   K 4.3 01/18/2018 1227   CL 102 01/18/2018 1227   CO2 22 01/18/2018 1227   GLUCOSE 82 01/18/2018 1227   BUN 11 01/18/2018 1227   CREATININE 0.65 01/18/2018 1227   CALCIUM 9.1 01/18/2018 1227   GFRNONAA 116 01/18/2018 1227   GFRAA 134 01/18/2018 1227   Lab Results  Component Value Date   HGBA1C 5.5 01/18/2018   Lab Results  Component Value Date   INSULIN 22.0 01/18/2018   CBC    Component Value Date/Time   WBC 5.7 01/18/2018 1227   RBC 4.44 01/18/2018 1227   HGB 13.2 01/18/2018 1227   HCT 39.7 01/18/2018 1227   MCV 89  01/18/2018 1227   MCH 29.7 01/18/2018 1227   MCHC 33.2 01/18/2018 1227   RDW 14.0 01/18/2018 1227   LYMPHSABS 1.3 01/18/2018 1227   EOSABS 0.1 01/18/2018 1227   BASOSABS 0.0 01/18/2018 1227   Iron/TIBC/Ferritin/ %Sat No results found for: IRON, TIBC, FERRITIN, IRONPCTSAT Lipid Panel     Component Value Date/Time   CHOL 181 01/18/2018 1227   TRIG 124 01/18/2018 1227   HDL 46 01/18/2018 1227   LDLCALC 110 (H) 01/18/2018 1227   Hepatic Function Panel     Component Value Date/Time   PROT 6.7 01/18/2018 1227  ALBUMIN 4.2 01/18/2018 1227   AST 14 01/18/2018 1227   ALT 15 01/18/2018 1227   ALKPHOS 52 01/18/2018 1227   BILITOT 0.2 01/18/2018 1227      Component Value Date/Time   TSH 2.260 01/18/2018 1227   Results for VIRDELL, HOILAND (MRN [id number]) as of 04/26/2018 06:55  Ref. Range 01/18/2018 12:27  Vitamin D, 25-Hydroxy Latest Ref Range: 30.0 - 100.0 ng/mL 56.8    OBESITY BEHAVIORAL INTERVENTION VISIT  Today's visit was # 6   Starting weight: 248 lbs Starting date: 01/18/18 Today's weight : Weight: 230 lb (104.3 kg)  Today's date: 04/24/2018 Total lbs lost to date: 47  ASK: We discussed the diagnosis of obesity with Marliss Czar today and Maguadalupe agreed to give [place] permission to discuss obesity behavioral modification therapy today.  ASSESS: Rinnah has the diagnosis of obesity and her BMI today is 34.9. Talisha is in the action stage of change.   ADVISE: Louanne was educated on the multiple health risks of obesity as well as the benefit of weight loss to improve her health. She was advised of the need for long term treatment and the importance of lifestyle modifications to improve her current health and to decrease her risk of future health problems.  AGREE: Multiple dietary modification options and treatment options were discussed and Laelynn agreed to follow the recommendations documented in the above note.  ARRANGE: Amaura was educated on the importance of frequent visits to  treat obesity as outlined per CMS and USPSTF guidelines and agreed to schedule her next follow up appointment today.  I, Kirke Corin, am acting as transcriptionist for Wilder Glade, MD  I have reviewed the above documentation for accuracy and completeness, and I agree with the above. -Quillian Quince, MD

## 2018-05-29 ENCOUNTER — Ambulatory Visit (INDEPENDENT_AMBULATORY_CARE_PROVIDER_SITE_OTHER): Payer: 59 | Admitting: Family Medicine

## 2018-05-29 ENCOUNTER — Encounter (INDEPENDENT_AMBULATORY_CARE_PROVIDER_SITE_OTHER): Payer: Self-pay | Admitting: Family Medicine

## 2018-05-29 VITALS — BP 115/73 | HR 72 | Temp 98.4°F | Ht 68.0 in | Wt 235.0 lb

## 2018-05-29 DIAGNOSIS — F3289 Other specified depressive episodes: Secondary | ICD-10-CM

## 2018-05-29 DIAGNOSIS — E038 Other specified hypothyroidism: Secondary | ICD-10-CM

## 2018-05-29 DIAGNOSIS — Z6835 Body mass index (BMI) 35.0-35.9, adult: Secondary | ICD-10-CM

## 2018-05-29 DIAGNOSIS — E559 Vitamin D deficiency, unspecified: Secondary | ICD-10-CM | POA: Diagnosis not present

## 2018-05-29 DIAGNOSIS — Z9189 Other specified personal risk factors, not elsewhere classified: Secondary | ICD-10-CM | POA: Diagnosis not present

## 2018-05-29 DIAGNOSIS — E7849 Other hyperlipidemia: Secondary | ICD-10-CM

## 2018-05-29 DIAGNOSIS — E8881 Metabolic syndrome: Secondary | ICD-10-CM

## 2018-05-29 MED ORDER — BUPROPION HCL ER (SR) 150 MG PO TB12: 150.0000 mg | ORAL_TABLET | Freq: Every day | ORAL | 0 refills | Status: DC

## 2018-05-29 NOTE — Progress Notes (Signed)
Office: [phone]  /  Fax: [phone]   HPI:   Chief Complaint: OBESITY Cathy Vargas is here to discuss her progress with her obesity treatment plan. She is keeping a food journal with 1200 to 1500 calories and 80 grams of protein and is following her eating plan approximately 25 % of the time. She states she is exercising 0 minutes 0 times per week. Cathy Vargas indulged over the holidays and has gained weight, but states that she is ready to get back on track. Her husband has been supportive of her healthier eating and has lost weight with her.  Her weight is 235 lb (106.6 kg) today and has had a weight gain of 5 pounds over a period of 5 weeks since her last visit. She has lost 13 lbs since starting treatment with Cathy Vargas.  Depression with emotional eating behaviors This is a new diagnosis for Cathy Vargas, as she notes increased emotional eating. It's worse in the evening, worse with increased stress, and has been worse in the last month. She is struggling with emotional eating and using food for comfort to the extent that it is negatively impacting her health. She often snacks when she is not hungry. Cathy Vargas sometimes feels she is out of control and then feels guilty that she made poor food choices. She has been working on behavior modification techniques to help reduce her emotional eating and has been somewhat successful. She shows no sign of suicidal or homicidal ideations.  Hyperlipidemia Cathy Vargas has hyperlipidemia and has been attempting to improve her cholesterol levels with diet control, exercise, and weight loss. She is due for labs today and she denies any chest pain.  Vitamin D deficiency Cathy Vargas has a diagnosis of vitamin D deficiency. She is no longer taking vit D. Her last level was at goal and she denies nausea, vomiting, or muscle weakness.  Hypothyroid Cathy Vargas has a diagnosis of hypothyroidism. She is on Synthroid 150mcg and she is due for labs today. She denies hot or cold intolerance or  palpitations.  Insulin Resistance Cathy Vargas has a diagnosis of insulin resistance based on her elevated fasting insulin level >5. Although Cathy Vargas's blood glucose readings are still under good control, insulin resistance puts her at greater risk of metabolic syndrome and diabetes. She is not taking metformin currently and is attempting to improve with diet and exercise to decrease risk of diabetes. Cathy Vargas notes polyphagia in the evenings, which is worse with increased simple carbs.  At risk for diabetes Cathy Vargas is at higher than average risk for developing diabetes due to her insulin resistance and obesity. She currently denies polyuria or polydipsia.  ASSESSMENT AND PLAN:  Other hyperlipidemia - Plan: Lipid Panel With LDL/HDL Ratio  Vitamin D deficiency - Plan: VITAMIN D 25 Hydroxy (Vit-D Deficiency, Fractures)  Other specified hypothyroidism - Plan: T3, TSH  Insulin resistance - Plan: Comprehensive metabolic panel, Hemoglobin A1c, Insulin, random  Other depression - with emotional eating - Plan: buPROPion (WELLBUTRIN SR) 150 MG 12 hr tablet  At risk for diabetes mellitus  Class 2 severe obesity with serious comorbidity and body mass index (BMI) of 35.0 to 35.9 in adult, unspecified obesity type (HCC)  PLAN:  Hyperlipidemia Cathy Vargas was informed of the American Heart Association Guidelines emphasizing intensive lifestyle modifications as the first line treatment for hyperlipidemia. We discussed many lifestyle modifications today in depth, and Cathy Vargas will continue to work on decreasing saturated fats such as fatty red meat, butter and many fried foods. She will also increase vegetables and lean protein  in her diet and continue to work on exercise and weight loss efforts. We will order labs today and Cathy Vargas will continue with her diet and weight loss. She agrees with this plan and will follow up in 2 weeks.  Vitamin D Deficiency Cathy Vargas was informed that low vitamin D levels contributes to fatigue and are  associated with obesity, breast, and colon cancer. We will check her vitamin D level today and Cathy Vargas agrees to follow up in 2 weeks.  Hypothyroid Cathy Vargas was informed of the importance of good thyroid control to help with weight loss efforts. She was also informed that supertherapeutic thyroid levels are dangerous and will not improve weight loss results. Labs were ordered and Cathy Vargas agreed to continue her medications as prescribed. She will follow up as directed.  Insulin Resistance Cathy Vargas will continue to work on weight loss, exercise, and decreasing simple carbohydrates in her diet to help decrease the risk of diabetes.  She was informed that eating too many simple carbohydrates or too many calories at one sitting increases the likelihood of GI side effects. We will check labs today. Cathy Vargas agreed to get back on her diet and to follow up with [place] as directed to monitor her progress.  Diabetes risk counseling Cathy Vargas was given extended (15 minutes) diabetes prevention counseling today. She is 33 y.o. female and has risk factors for diabetes including insulin resistance and obesity. We discussed intensive lifestyle modifications today with an emphasis on weight loss as well as increasing exercise and decreasing simple carbohydrates in her diet.  Depression with Emotional Eating Behaviors We discussed behavior modification techniques today to help Cathy Vargas deal with her emotional eating and depression. She has agreed to start to take Wellbutrin SR 150 mg qAM #30 with no refills and agreed to follow up as directed in 2 weeks.  Obesity Cathy Vargas is currently in the action stage of change. As such, her goal is to continue with weight loss efforts. She has agreed to keep a food journal with 1200 to 1500 calories and 80+ grams of protein or follow the Category 2 plan. Cathy Vargas has been instructed to work up to a goal of 150 minutes of combined cardio and strengthening exercise per week for weight loss and overall health  benefits. We discussed the following Behavioral Modification Strategies today: increasing lean protein intake, decreasing simple carbohydrates, work on meal planning and easy cooking plans, and dealing with family or coworker sabotage.  Cathy Vargas has agreed to follow up with our clinic in 2 weeks. She was informed of the importance of frequent follow up visits to maximize her success with intensive lifestyle modifications for her multiple health conditions.  ALLERGIES: No Known Allergies  MEDICATIONS: Current Outpatient Medications on File Prior to Visit  Medication Sig Dispense Refill  . Cholecalciferol 4000 units CAPS Take 1 capsule by mouth daily.    [name] levothyroxine (SYNTHROID, LEVOTHROID) 150 MCG tablet 1 TABLET ON AN EMPTY STOMACH IN THE MORNING ONCE A DAY  0  . loratadine (CLARITIN) 10 MG tablet Take 10 mg by mouth daily.    . Melatonin 3 MG TABS Take 1 tablet by mouth at bedtime.    . Norethindrone-Ethinyl Estradiol-Fe Biphas (LO LOESTRIN FE) 1 MG-10 MCG / 10 MCG tablet Take 1 tablet by mouth daily.    . propranolol (INDERAL) 60 MG tablet Take 60 mg by mouth at bedtime.     No current facility-administered medications on file prior to visit.     PAST MEDICAL HISTORY: Past Medical History:  Diagnosis Date  . Abnormal Pap smear of cervix 2015  . Anxiety   . Back pain   . Gallbladder disease   . Gallbladder problem   . HTN (hypertension)   . Hypothyroidism   . Joint pain   . PCOS (polycystic ovarian syndrome)   . Sciatica   . Thyroid disease   . Vitamin D deficiency     PAST SURGICAL HISTORY: Past Surgical History:  Procedure Laterality Date  . CHOLECYSTECTOMY  2010  . DILATION AND CURETTAGE OF UTERUS  2015  . OOPHORECTOMY    . WISDOM TOOTH EXTRACTION  2001    SOCIAL HISTORY: Social History   Tobacco Use  . Smoking status: Never Smoker  . Smokeless tobacco: Never Used  Substance Use Topics  . Alcohol use: No  . Drug use: No    FAMILY HISTORY: Family History   Problem Relation Age of Onset  . Hypertension Mother   . Obesity Mother   . Hypertension Father   . Alcoholism Father    ROS: Review of Systems  Constitutional: Negative for weight loss.  Cardiovascular: Negative for chest pain and palpitations.  Gastrointestinal: Negative for nausea and vomiting.  Genitourinary:       Negative for polyuria.  Musculoskeletal:       Negative for muscle weakness.  Endo/Heme/Allergies: Negative for polydipsia.       Negative for heat intolerance. Negative for cold intolerance. Positive for polyphagia.   Psychiatric/Behavioral: Positive for depression. Negative for suicidal ideas.       Negative for homicidal ideations.   PHYSICAL EXAM: Blood pressure 115/73, pulse 72, temperature 98.4 F (36.9 C), temperature source Oral, height 5\' 8"  (1.727 m), weight 235 lb (106.6 kg), SpO2 97 %. Body mass index is 35.73 kg/m. Physical Exam Vitals signs reviewed.  Constitutional:      Appearance: Normal appearance. She is obese.  Cardiovascular:     Rate and Rhythm: Normal rate.  Pulmonary:     Effort: Pulmonary effort is normal.  Musculoskeletal: Normal range of motion.  Skin:    General: Skin is warm and dry.  Neurological:     Mental Status: She is alert and oriented to person, place, and time.  Psychiatric:        Mood and Affect: Mood normal.        Behavior: Behavior normal.    RECENT LABS AND TESTS: BMET    Component Value Date/Time   NA 140 01/18/2018 1227   K 4.3 01/18/2018 1227   CL 102 01/18/2018 1227   CO2 22 01/18/2018 1227   GLUCOSE 82 01/18/2018 1227   BUN 11 01/18/2018 1227   CREATININE 0.65 01/18/2018 1227   CALCIUM 9.1 01/18/2018 1227   GFRNONAA 116 01/18/2018 1227   GFRAA 134 01/18/2018 1227   Lab Results  Component Value Date   HGBA1C 5.5 01/18/2018   Lab Results  Component Value Date   INSULIN 22.0 01/18/2018   CBC    Component Value Date/Time   WBC 5.7 01/18/2018 1227   RBC 4.44 01/18/2018 1227   HGB  13.2 01/18/2018 1227   HCT 39.7 01/18/2018 1227   MCV 89 01/18/2018 1227   MCH 29.7 01/18/2018 1227   MCHC 33.2 01/18/2018 1227   RDW 14.0 01/18/2018 1227   LYMPHSABS 1.3 01/18/2018 1227   EOSABS 0.1 01/18/2018 1227   BASOSABS 0.0 01/18/2018 1227   Iron/TIBC/Ferritin/ %Sat No results found for: IRON, TIBC, FERRITIN, IRONPCTSAT Lipid Panel     Component Value Date/Time  CHOL 181 01/18/2018 1227   TRIG 124 01/18/2018 1227   HDL 46 01/18/2018 1227   LDLCALC 110 (H) 01/18/2018 1227   Hepatic Function Panel     Component Value Date/Time   PROT 6.7 01/18/2018 1227   ALBUMIN 4.2 01/18/2018 1227   AST 14 01/18/2018 1227   ALT 15 01/18/2018 1227   ALKPHOS 52 01/18/2018 1227   BILITOT 0.2 01/18/2018 1227      Component Value Date/Time   TSH 2.260 01/18/2018 1227   Results for JARIN, HOLES (MRN [id number]) as of 05/29/2018 10:05  Ref. Range 01/18/2018 12:27  Vitamin D, 25-Hydroxy Latest Ref Range: 30.0 - 100.0 ng/mL 56.8    OBESITY BEHAVIORAL INTERVENTION VISIT  Today's visit was # 7   Starting weight: 248 lbs Starting date: 01/18/18 Today's weight : Weight: 235 lb (106.6 kg)  Today's date: 05/29/2018 Total lbs lost to date: 13  ASK: We discussed the diagnosis of obesity with Cathy Vargas today and Cathy Vargas agreed to give [place] permission to discuss obesity behavioral modification therapy today.  ASSESS: Cathy Vargas has the diagnosis of obesity and her BMI today is 35.7. Cathy Vargas is in the action stage of change.   ADVISE: Cathy Vargas was educated on the multiple health risks of obesity as well as the benefit of weight loss to improve her health. She was advised of the need for long term treatment and the importance of lifestyle modifications to improve her current health and to decrease her risk of future health problems.  AGREE: Multiple dietary modification options and treatment options were discussed and Alexander agreed to follow the recommendations documented in the above  note.  ARRANGE: Pranathi was educated on the importance of frequent visits to treat obesity as outlined per CMS and USPSTF guidelines and agreed to schedule her next follow up appointment today.  I, Kirke Corin, am acting as transcriptionist for Wilder Glade, MD  I have reviewed the above documentation for accuracy and completeness, and I agree with the above. -Cathy Vargas Quince, MD

## 2018-05-30 LAB — TSH: TSH: 5.99 u[IU]/mL — AB (ref 0.450–4.500)

## 2018-05-30 LAB — VITAMIN D 25 HYDROXY (VIT D DEFICIENCY, FRACTURES): Vit D, 25-Hydroxy: 50.8 ng/mL (ref 30.0–100.0)

## 2018-05-30 LAB — COMPREHENSIVE METABOLIC PANEL
ALBUMIN: 4.1 g/dL (ref 3.8–4.8)
ALT: 14 IU/L (ref 0–32)
AST: 11 IU/L (ref 0–40)
Albumin/Globulin Ratio: 1.7 (ref 1.2–2.2)
Alkaline Phosphatase: 59 IU/L (ref 39–117)
BUN / CREAT RATIO: 18 (ref 9–23)
BUN: 13 mg/dL (ref 6–20)
CHLORIDE: 105 mmol/L (ref 96–106)
CO2: 20 mmol/L (ref 20–29)
CREATININE: 0.73 mg/dL (ref 0.57–1.00)
Calcium: 8.9 mg/dL (ref 8.7–10.2)
GFR calc Af Amer: 124 mL/min/{1.73_m2} (ref >59)
GFR calc non Af Amer: 108 mL/min/{1.73_m2} (ref >59)
GLUCOSE: 113 mg/dL — AB (ref 65–99)
Globulin, Total: 2.4 g/dL (ref 1.5–4.5)
Potassium: 4.1 mmol/L (ref 3.5–5.2)
Sodium: 141 mmol/L (ref 134–144)
Total Protein: 6.5 g/dL (ref 6.0–8.5)

## 2018-05-30 LAB — LIPID PANEL WITH LDL/HDL RATIO
CHOLESTEROL TOTAL: 174 mg/dL (ref 100–199)
HDL: 42 mg/dL (ref >39)
LDL Calculated: 103 mg/dL — ABNORMAL HIGH (ref 0–99)
LDl/HDL Ratio: 2.5 ratio (ref 0.0–3.2)
TRIGLYCERIDES: 147 mg/dL (ref 0–149)
VLDL Cholesterol Cal: 29 mg/dL (ref 5–40)

## 2018-05-30 LAB — T4, FREE: Free T4: 1.11 ng/dL (ref 0.82–1.77)

## 2018-05-30 LAB — T3: T3 TOTAL: 128 ng/dL (ref 71–180)

## 2018-05-30 LAB — HEMOGLOBIN A1C
Est. average glucose Bld gHb Est-mCnc: 108 mg/dL
HEMOGLOBIN A1C: 5.4 % (ref 4.8–5.6)

## 2018-05-30 LAB — INSULIN, RANDOM: INSULIN: 46.3 u[IU]/mL — AB (ref 2.6–24.9)

## 2018-06-19 ENCOUNTER — Encounter (INDEPENDENT_AMBULATORY_CARE_PROVIDER_SITE_OTHER): Payer: Self-pay | Admitting: Family Medicine

## 2018-06-19 ENCOUNTER — Ambulatory Visit (INDEPENDENT_AMBULATORY_CARE_PROVIDER_SITE_OTHER): Payer: 59 | Admitting: Family Medicine

## 2018-06-19 VITALS — BP 126/81 | HR 63 | Temp 98.0°F | Ht 68.0 in | Wt 232.0 lb

## 2018-06-19 DIAGNOSIS — Z9189 Other specified personal risk factors, not elsewhere classified: Secondary | ICD-10-CM | POA: Diagnosis not present

## 2018-06-19 DIAGNOSIS — F3289 Other specified depressive episodes: Secondary | ICD-10-CM

## 2018-06-19 DIAGNOSIS — Z6835 Body mass index (BMI) 35.0-35.9, adult: Secondary | ICD-10-CM

## 2018-06-19 MED ORDER — BUPROPION HCL ER (SR) 150 MG PO TB12: 150.0000 mg | ORAL_TABLET | Freq: Every day | ORAL | 0 refills | Status: DC

## 2018-06-20 NOTE — Progress Notes (Signed)
Office: [phone]  /  Fax: [phone]   HPI:   Chief Complaint: OBESITY Cathy Vargas is here to discuss her progress with her obesity treatment plan. She is on the keep a food journal with 1200-1500 calories and 80+ grams of protein daily or follow the Category 2 plan and is following her eating plan approximately 75 % of the time. She states she is exercising 0 minutes 0 times per week. [name] continue to do well with weight loss on her Category 2 plan. She notes her hunger is controlled, but she is still struggling with meal prep.  Her weight is 232 lb (105.2 kg) today and has had a weight loss of 3 pounds over a period of 3 weeks since her last visit. She has lost 16 lbs since starting treatment with [place].  Depression with emotional eating behaviors [name] notes her mood improved on Wellbutrin and decreased emotional eating. She feels more in control of cravings. Her blood pressure is stable and she denies insomnia. Flor struggles with emotional eating and using food for comfort to the extent that it is negatively impacting her health. She often snacks when she is not hungry. [name] sometimes feels she is out of control and then feels guilty that she made poor food choices. She has been working on behavior modification techniques to help reduce her emotional eating and has been somewhat successful. She shows no sign of suicidal or homicidal ideations.  Depression screen PHQ 2/9 01/18/2018  Decreased Interest 3  Down, Depressed, Hopeless 3  PHQ - 2 Score 6  Altered sleeping 3  Tired, decreased energy 3  Change in appetite 3  Feeling bad or failure about yourself  2  Trouble concentrating 1  Moving slowly or fidgety/restless 0  Suicidal thoughts 0  PHQ-9 Score 18  Difficult doing work/chores Very difficult   At risk for cardiovascular disease [name] is at a higher than average risk for cardiovascular disease due to obesity. She currently denies any chest pain.  ASSESSMENT AND PLAN:  Other  depression - with emotional eating - Plan: buPROPion (WELLBUTRIN SR) 150 MG 12 hr tablet  At risk for heart disease  Class 2 severe obesity with serious comorbidity and body mass index (BMI) of 35.0 to 35.9 in adult, unspecified obesity type (HCC)  PLAN:  Depression with Emotional Eating Behaviors We discussed behavior modification techniques today to help [name] deal with her emotional eating and depression. [name] agrees to continue taking Wellbutrin SR 150 mg qd #90 day supply with no refills. [name] agrees to follow up with our clinic in 2 to 3 weeks.  Cardiovascular risk counseling Chevonne was given extended (15 minutes) coronary artery disease prevention counseling today. She is 33 y.o. female and has risk factors for heart disease including obesity. We discussed intensive lifestyle modifications today with an emphasis on specific weight loss instructions and strategies. Pt was also informed of the importance of increasing exercise and decreasing saturated fats to help prevent heart disease.  Obesity [name] is currently in the action stage of change. As such, her goal is to continue with weight loss efforts She has agreed to follow the Category 2 plan [name] has been instructed to work up to a goal of 150 minutes of combined cardio and strengthening exercise per week for weight loss and overall health benefits. We discussed the following Behavioral Modification Strategies today: increasing lean protein intake, decreasing simple carbohydrates  and work on meal planning and easy cooking plans   [name] has agreed to  follow up with our clinic in 2 to 3 weeks. She was informed of the importance of frequent follow up visits to maximize her success with intensive lifestyle modifications for her multiple health conditions.  ALLERGIES: No Known Allergies  MEDICATIONS: Current Outpatient Medications on File Prior to Visit  Medication Sig Dispense Refill  . Cholecalciferol 4000 units CAPS Take 1 capsule by  mouth daily.    [name]. levothyroxine (SYNTHROID, LEVOTHROID) 150 MCG tablet 1 TABLET ON AN EMPTY STOMACH IN THE MORNING ONCE A DAY  0  . loratadine (CLARITIN) 10 MG tablet Take 10 mg by mouth daily.    . Melatonin 3 MG TABS Take 1 tablet by mouth at bedtime.    . Norethindrone-Ethinyl Estradiol-Fe Biphas (LO LOESTRIN FE) 1 MG-10 MCG / 10 MCG tablet Take 1 tablet by mouth daily.    . propranolol (INDERAL) 60 MG tablet Take 60 mg by mouth at bedtime.     No current facility-administered medications on file prior to visit.     PAST MEDICAL HISTORY: Past Medical History:  Diagnosis Date  . Abnormal Pap smear of cervix 2015  . Anxiety   . Back pain   . Gallbladder disease   . Gallbladder problem   . HTN (hypertension)   . Hypothyroidism   . Joint pain   . PCOS (polycystic ovarian syndrome)   . Sciatica   . Thyroid disease   . Vitamin D deficiency     PAST SURGICAL HISTORY: Past Surgical History:  Procedure Laterality Date  . CHOLECYSTECTOMY  2010  . DILATION AND CURETTAGE OF UTERUS  2015  . OOPHORECTOMY    . WISDOM TOOTH EXTRACTION  2001    SOCIAL HISTORY: Social History   Tobacco Use  . Smoking status: Never Smoker  . Smokeless tobacco: Never Used  Substance Use Topics  . Alcohol use: No  . Drug use: No    FAMILY HISTORY: Family History  Problem Relation Age of Onset  . Hypertension Mother   . Obesity Mother   . Hypertension Father   . Alcoholism Father     ROS: Review of Systems  Constitutional: Positive for weight loss.  Psychiatric/Behavioral: Positive for depression. Negative for suicidal ideas. The patient does not have insomnia.     PHYSICAL EXAM: Blood pressure 126/81, pulse 63, temperature 98 F (36.7 C), temperature source Oral, height 5\' 8"  (1.727 m), weight 232 lb (105.2 kg), SpO2 100 %. Body mass index is 35.28 kg/m. Physical Exam Vitals signs reviewed.  Constitutional:      Appearance: Normal appearance. She is obese.  Cardiovascular:      Rate and Rhythm: Normal rate.     Pulses: Normal pulses.  Pulmonary:     Effort: Pulmonary effort is normal.     Breath sounds: Normal breath sounds.  Musculoskeletal: Normal range of motion.  Skin:    General: Skin is warm and dry.  Neurological:     Mental Status: She is alert and oriented to person, place, and time.  Psychiatric:        Mood and Affect: Mood normal.        Behavior: Behavior normal.     RECENT LABS AND TESTS: BMET    Component Value Date/Time   NA 141 05/29/2018 1049   K 4.1 05/29/2018 1049   CL 105 05/29/2018 1049   CO2 20 05/29/2018 1049   GLUCOSE 113 (H) 05/29/2018 1049   BUN 13 05/29/2018 1049   CREATININE 0.73 05/29/2018 1049   CALCIUM 8.9  05/29/2018 1049   GFRNONAA 108 05/29/2018 1049   GFRAA 124 05/29/2018 1049   Lab Results  Component Value Date   HGBA1C 5.4 05/29/2018   HGBA1C 5.5 01/18/2018   Lab Results  Component Value Date   INSULIN 46.3 (H) 05/29/2018   INSULIN 22.0 01/18/2018   CBC    Component Value Date/Time   WBC 5.7 01/18/2018 1227   RBC 4.44 01/18/2018 1227   HGB 13.2 01/18/2018 1227   HCT 39.7 01/18/2018 1227   MCV 89 01/18/2018 1227   MCH 29.7 01/18/2018 1227   MCHC 33.2 01/18/2018 1227   RDW 14.0 01/18/2018 1227   LYMPHSABS 1.3 01/18/2018 1227   EOSABS 0.1 01/18/2018 1227   BASOSABS 0.0 01/18/2018 1227   Iron/TIBC/Ferritin/ %Sat No results found for: IRON, TIBC, FERRITIN, IRONPCTSAT Lipid Panel     Component Value Date/Time   CHOL 174 05/29/2018 1049   TRIG 147 05/29/2018 1049   HDL 42 05/29/2018 1049   LDLCALC 103 (H) 05/29/2018 1049   Hepatic Function Panel     Component Value Date/Time   PROT 6.5 05/29/2018 1049   ALBUMIN 4.1 05/29/2018 1049   AST 11 05/29/2018 1049   ALT 14 05/29/2018 1049   ALKPHOS 59 05/29/2018 1049   BILITOT <0.2 05/29/2018 1049      Component Value Date/Time   TSH 5.990 (H) 05/29/2018 1049   TSH 2.260 01/18/2018 1227      OBESITY BEHAVIORAL INTERVENTION  VISIT  Today's visit was # 8   Starting weight: 248 lbs Starting date: 01/18/18 Today's weight : 232 lbs  Today's date: 06/19/2018 Total lbs lost to date: 16    ASK: We discussed the diagnosis of obesity with Marliss Czarawn M Azimi today and Titilayo agreed to give us permission to discuss obesity behavioral modification therapy today.  ASSESS: Alvis LemmingsDawn has the diagnosis of obesity and her BMI today is 35.28 Laurissa is in the action stage of change   ADVISE: Alvis LemmingsDawn was educated on the multiple health risks of obesity as well as the benefit of weight loss to improve her health. She was advised of the need for long term treatment and the importance of lifestyle modifications to improve her current health and to decrease her risk of future health problems.  AGREE: Multiple dietary modification options and treatment options were discussed and  Shamariah agreed to follow the recommendations documented in the above note.  ARRANGE: Miquela was educated on the importance of frequent visits to treat obesity as outlined per CMS and USPSTF guidelines and agreed to schedule her next follow up appointment today.  I, Burt KnackSharon Martin, am acting as transcriptionist for Quillian Quincearen [name] Pedro, MD  I have reviewed the above documentation for accuracy and completeness, and I agree with the above. -Quillian Quincearen Tinia Oravec, MD

## 2018-07-11 ENCOUNTER — Ambulatory Visit (INDEPENDENT_AMBULATORY_CARE_PROVIDER_SITE_OTHER): Payer: Self-pay | Admitting: Family Medicine

## 2018-07-20 ENCOUNTER — Encounter (INDEPENDENT_AMBULATORY_CARE_PROVIDER_SITE_OTHER): Payer: Self-pay | Admitting: Family Medicine

## 2018-07-26 ENCOUNTER — Ambulatory Visit (INDEPENDENT_AMBULATORY_CARE_PROVIDER_SITE_OTHER): Payer: Self-pay | Admitting: Family Medicine

## 2018-07-26 ENCOUNTER — Encounter (INDEPENDENT_AMBULATORY_CARE_PROVIDER_SITE_OTHER): Payer: Self-pay

## 2018-08-07 ENCOUNTER — Encounter (INDEPENDENT_AMBULATORY_CARE_PROVIDER_SITE_OTHER): Payer: Self-pay

## 2018-08-14 ENCOUNTER — Encounter (INDEPENDENT_AMBULATORY_CARE_PROVIDER_SITE_OTHER): Payer: Self-pay

## 2018-08-14 ENCOUNTER — Other Ambulatory Visit (INDEPENDENT_AMBULATORY_CARE_PROVIDER_SITE_OTHER): Payer: Self-pay | Admitting: Family Medicine

## 2018-08-14 DIAGNOSIS — F3289 Other specified depressive episodes: Secondary | ICD-10-CM

## 2019-03-12 ENCOUNTER — Telehealth: Payer: 59 | Admitting: Physician Assistant

## 2019-03-12 DIAGNOSIS — R059 Cough, unspecified: Secondary | ICD-10-CM

## 2019-03-12 DIAGNOSIS — R05 Cough: Secondary | ICD-10-CM

## 2019-03-12 DIAGNOSIS — Z20822 Contact with and (suspected) exposure to covid-19: Secondary | ICD-10-CM

## 2019-03-12 DIAGNOSIS — Z20828 Contact with and (suspected) exposure to other viral communicable diseases: Secondary | ICD-10-CM

## 2019-03-12 MED ORDER — PROMETHAZINE-DM 6.25-15 MG/5ML PO SYRP: 5.0000 mL | ORAL_SOLUTION | Freq: Four times a day (QID) | ORAL | 0 refills | Status: DC | PRN

## 2019-03-12 MED ORDER — BENZONATATE 100 MG PO CAPS: 100.0000 mg | ORAL_CAPSULE | Freq: Three times a day (TID) | ORAL | 0 refills | Status: DC | PRN

## 2019-03-12 NOTE — Progress Notes (Signed)
E-Visit for Corona Virus Screening   Your current symptoms could be consistent with the coronavirus.  Many health care providers can now test patients at their office but not all are.  [facility] has multiple testing sites. For information on our COVID testing locations and hours go to [email or web address]  Please quarantine yourself while awaiting your test results.  We are enrolling you in our MyChart Home Montioring for COVID19 . Daily you will receive a questionnaire within the MyChart website. Our COVID 19 response team willl be monitoriing your responses daily.    COVID-19 is a respiratory illness with symptoms that are similar to the flu. Symptoms are typically mild to moderate, but there have been cases of severe illness and death due to the virus. The following symptoms may appear 2-14 days after exposure: . Fever . Cough . Shortness of breath or difficulty breathing . Chills . Repeated shaking with chills . Muscle pain . Headache . Sore throat . New loss of taste or smell . Fatigue . Congestion or runny nose . Nausea or vomiting . Diarrhea  It is vitally important that if you feel that you have an infection such as this virus or any other virus that you stay home and away from places where you may spread it to others.  You should self-quarantine for 14 days if you have symptoms that could potentially be coronavirus or have been in close contact a with a person diagnosed with COVID-19 within the last 2 weeks. You should avoid contact with people age 65 and older.   You should wear a mask or cloth face covering over your nose and mouth if you must be around other people or animals, including pets (even at home). Try to stay at least 6 feet away from other people. This will protect the people around you.  You can use medication such as A prescription cough medication called Tessalon Perles 100 mg. You may take 1-2 capsules every 8 hours as needed for cough  and A prescription cough medication called Phenergan DM 6.25 mg/15 mg. You make take one teaspoon / 5 ml every 4-6 hours as needed for cough  You may also take acetaminophen (Tylenol) as needed for fever.   Reduce your risk of any infection by using the same precautions used for avoiding the common cold or flu:  . Wash your hands often with soap and warm water for at least 20 seconds.  If soap and water are not readily available, use an alcohol-based hand sanitizer with at least 60% alcohol.  . If coughing or sneezing, cover your mouth and nose by coughing or sneezing into the elbow areas of your shirt or coat, into a tissue or into your sleeve (not your hands). . Avoid shaking hands with others and consider head nods or verbal greetings only. . Avoid touching your eyes, nose, or mouth with unwashed hands.  . Avoid close contact with people who are sick. . Avoid places or events with large numbers of people in one location, like concerts or sporting events. . Carefully consider travel plans you have or are making. . If you are planning any travel outside or inside the [place], visit the CDC's Travelers' Health webpage for the latest health notices. . If you have some symptoms but not all symptoms, continue to monitor at home and seek medical attention if your symptoms worsen. . If you are having a medical emergency, call 911.  HOME CARE . Only take medications as instructed by your   medical team. . Drink plenty of fluids and get plenty of rest. . A steam or ultrasonic humidifier can help if you have congestion.   GET HELP RIGHT AWAY IF YOU HAVE EMERGENCY WARNING SIGNS** FOR COVID-19. If you or someone is showing any of these signs seek emergency medical care immediately. Call 911 or proceed to your closest emergency facility if: . You develop worsening high fever. . Trouble breathing . Bluish lips or face . Persistent pain or pressure in the chest . New confusion . Inability to wake or stay  awake . You cough up blood. . Your symptoms become more severe  **This list is not all possible symptoms. Contact your medical provider for any symptoms that are sever or concerning to you.   MAKE SURE YOU   Understand these instructions.  Will watch your condition.  Will get help right away if you are not doing well or get worse.  Your e-visit answers were reviewed by a board certified advanced clinical practitioner to complete your personal care plan.  Depending on the condition, your plan could have included both over the counter or prescription medications.  If there is a problem please reply once you have received a response from your provider.  Your safety is important to us.  If you have drug allergies check your prescription carefully.    You can use MyChart to ask questions about today's visit, request a non-urgent call back, or ask for a work or school excuse for 24 hours related to this e-Visit. If it has been greater than 24 hours you will need to follow up with your provider, or enter a new e-Visit to address those concerns. You will get an e-mail in the next two days asking about your experience.  I hope that your e-visit has been valuable and will speed your recovery. Thank you for using e-visits.   Greater than 5 minutes, yet less than 10 minutes of time have been spent researching, coordinating and implementing care for this patient today.   

## 2019-03-13 ENCOUNTER — Telehealth: Payer: Self-pay

## 2019-03-13 ENCOUNTER — Encounter (INDEPENDENT_AMBULATORY_CARE_PROVIDER_SITE_OTHER): Payer: Self-pay

## 2019-03-13 NOTE — Telephone Encounter (Signed)
Patient advise on weakness and change in appetite per protocol:   If appetite becomes worse: encourage patient to drink fluids as tolerated, work their way up to bland solid food such as crackers, pretzels, soup, bread or applesauce and boiled starches.   If patient is unable to tolerate any foods or liquids, notify PCP.   IF PATIENT DEVELOPS SEVERE VOMITING (MORE THAN 6 TIMES A DAY AND OR >8 HOURS) AND/OR SEVERE ABDOMINAL PAIN ADVISE PATIENT TO CALL 911 AND SEEK TREATMENT IN ED   IF PATIENT HAS WORSENING WEAKNESS WITH INABILITY TO STAND OR IF PATIENT HAS TO HOLD ON TO SOMETHING TO GET BALANCE, ADVISE PATIENT TO CALL 911 AND SEEK TREATMENT IN ED   Patient verbalized understanding and agrees with plan.

## 2019-03-14 ENCOUNTER — Encounter (INDEPENDENT_AMBULATORY_CARE_PROVIDER_SITE_OTHER): Payer: Self-pay

## 2019-03-15 ENCOUNTER — Encounter (INDEPENDENT_AMBULATORY_CARE_PROVIDER_SITE_OTHER): Payer: Self-pay

## 2019-03-16 ENCOUNTER — Encounter (INDEPENDENT_AMBULATORY_CARE_PROVIDER_SITE_OTHER): Payer: Self-pay

## 2019-03-17 ENCOUNTER — Encounter (INDEPENDENT_AMBULATORY_CARE_PROVIDER_SITE_OTHER): Payer: Self-pay

## 2019-03-18 ENCOUNTER — Encounter (INDEPENDENT_AMBULATORY_CARE_PROVIDER_SITE_OTHER): Payer: Self-pay

## 2019-03-19 ENCOUNTER — Telehealth: Payer: Self-pay

## 2019-03-19 ENCOUNTER — Encounter (INDEPENDENT_AMBULATORY_CARE_PROVIDER_SITE_OTHER): Payer: Self-pay

## 2019-03-19 NOTE — Telephone Encounter (Signed)
Patient advise on diarrhea per protocol:   If diarrhea remains the same: encourage patient to drink oral fluids and bland foods.   Avoid alcohol, spicy foods, caffeine or fatty foods that could make diarrhea worse.   Continue to monitor for signs of dehydration (increased thirst decreased urine output, yellow urine, dry skin, headache or dizziness).   Advise patient to try OTC medication (Imodium, kaopectate, Pepto-Bismol) as per manufacturer's instructions.    If worsening diarrhea occurs and becomes severe (6-7 bowel movements a day): notify PCP   If diarrhea last greater than 7 days: notify PCP   IF SIGNS OF DEHYDRATION OCCUR (INCREASED THIRST, DECREASED URINE OUTPUT, YELLOW URINE, DRY SKIN, HEADACHE OR DIZZINESS) ADVISE PATIENT TO CALL [facility] IN THE ED  Patient states that she has had 4-5 bowel movements from yesterday to today. Patient also having some chest tightness. Patient will follow up with pcp regarding the chest discomfort. Patient doesn't have pain or sob.

## 2019-03-20 ENCOUNTER — Encounter (INDEPENDENT_AMBULATORY_CARE_PROVIDER_SITE_OTHER): Payer: Self-pay

## 2019-03-21 ENCOUNTER — Encounter (INDEPENDENT_AMBULATORY_CARE_PROVIDER_SITE_OTHER): Payer: Self-pay

## 2019-03-22 ENCOUNTER — Encounter (INDEPENDENT_AMBULATORY_CARE_PROVIDER_SITE_OTHER): Payer: Self-pay

## 2019-03-23 ENCOUNTER — Encounter (INDEPENDENT_AMBULATORY_CARE_PROVIDER_SITE_OTHER): Payer: Self-pay

## 2019-03-24 ENCOUNTER — Encounter (INDEPENDENT_AMBULATORY_CARE_PROVIDER_SITE_OTHER): Payer: Self-pay

## 2019-03-25 ENCOUNTER — Encounter (INDEPENDENT_AMBULATORY_CARE_PROVIDER_SITE_OTHER): Payer: Self-pay

## 2019-12-26 LAB — CHLAMYDIA/N. GONORRHOEAE DNA, SDA: Chlamydia, Swab/Urine, PCR: NEGATIVE

## 2020-12-03 ENCOUNTER — Emergency Department (HOSPITAL_BASED_OUTPATIENT_CLINIC_OR_DEPARTMENT_OTHER): Payer: 59 | Admitting: Radiology

## 2020-12-03 ENCOUNTER — Encounter (HOSPITAL_BASED_OUTPATIENT_CLINIC_OR_DEPARTMENT_OTHER): Payer: Self-pay | Admitting: Obstetrics and Gynecology

## 2020-12-03 ENCOUNTER — Emergency Department (HOSPITAL_BASED_OUTPATIENT_CLINIC_OR_DEPARTMENT_OTHER)
Admission: EM | Admit: 2020-12-03 | Discharge: 2020-12-03 | Disposition: A | Discharge status: Home / Self Care | Payer: 59 | Attending: Emergency Medicine | Admitting: Emergency Medicine

## 2020-12-03 ENCOUNTER — Other Ambulatory Visit: Payer: Self-pay

## 2020-12-03 ENCOUNTER — Emergency Department (HOSPITAL_BASED_OUTPATIENT_CLINIC_OR_DEPARTMENT_OTHER): Payer: 59

## 2020-12-03 DIAGNOSIS — R072 Precordial pain: Secondary | ICD-10-CM | POA: Diagnosis not present

## 2020-12-03 DIAGNOSIS — Z20822 Contact with and (suspected) exposure to covid-19: Secondary | ICD-10-CM | POA: Insufficient documentation

## 2020-12-03 DIAGNOSIS — R079 Chest pain, unspecified: Secondary | ICD-10-CM

## 2020-12-03 DIAGNOSIS — E039 Hypothyroidism, unspecified: Secondary | ICD-10-CM | POA: Diagnosis not present

## 2020-12-03 DIAGNOSIS — Z79899 Other long term (current) drug therapy: Secondary | ICD-10-CM | POA: Insufficient documentation

## 2020-12-03 DIAGNOSIS — I10 Essential (primary) hypertension: Secondary | ICD-10-CM | POA: Insufficient documentation

## 2020-12-03 DIAGNOSIS — R112 Nausea with vomiting, unspecified: Secondary | ICD-10-CM | POA: Diagnosis not present

## 2020-12-03 LAB — CBC
HCT: 40.9 % (ref 36.0–46.0)
Hemoglobin: 14.2 g/dL (ref 12.0–15.0)
MCH: 30.2 pg (ref 26.0–34.0)
MCHC: 34.7 g/dL (ref 30.0–36.0)
MCV: 87 fL (ref 80.0–100.0)
Platelets: 233 10*3/uL (ref 150–400)
RBC: 4.7 MIL/uL (ref 3.87–5.11)
RDW: 12.4 % (ref 11.5–15.5)
WBC: 6.4 10*3/uL (ref 4.0–10.5)
nRBC: 0 % (ref 0.0–0.2)

## 2020-12-03 LAB — BASIC METABOLIC PANEL
Anion gap: 11 (ref 5–15)
BUN: 9 mg/dL (ref 6–20)
CO2: 24 mmol/L (ref 22–32)
Calcium: 9.1 mg/dL (ref 8.9–10.3)
Chloride: 104 mmol/L (ref 98–111)
Creatinine, Ser: 0.72 mg/dL (ref 0.44–1.00)
GFR, Estimated: >60 mL/min (ref >60)
Glucose, Bld: 89 mg/dL (ref 70–99)
Potassium: 3.6 mmol/L (ref 3.5–5.1)
Sodium: 139 mmol/L (ref 135–145)

## 2020-12-03 LAB — RESP PANEL BY RT-PCR (FLU A&B, COVID) ARPGX2
Influenza A by PCR: NEGATIVE
Influenza B by PCR: NEGATIVE
SARS Coronavirus 2 by RT PCR: NEGATIVE

## 2020-12-03 LAB — PREGNANCY, URINE: Preg Test, Ur: NEGATIVE

## 2020-12-03 LAB — TROPONIN I (HIGH SENSITIVITY)
Troponin I (High Sensitivity): <2 ng/L (ref <18)
Troponin I (High Sensitivity): <2 ng/L (ref <18)

## 2020-12-03 MED ORDER — IOHEXOL 350 MG/ML SOLN
100.0000 mL | Freq: Once | INTRAVENOUS | Status: AC | PRN
  Administered 2020-12-03: 100 mL via INTRAVENOUS

## 2020-12-03 NOTE — ED Provider Notes (Signed)
[facility] EMERGENCY DEPT Provider Note   CSN: [id number] Arrival date & time: 12/03/20  3151     History Chief Complaint  Patient presents with   Chest Pain    Cathy Vargas is a 35 y.o. female.  Patient is a 35 year old female with a history of hypertension, hypothyroidism, status postcholecystectomy who presents today due to sudden onset of chest pain.  Patient was sitting in her computer at 415 this afternoon when she developed a severe pain in the central part of her sternum that radiated into her jaw bilaterally and into her back.  She felt mildly short of breath during this episode, nauseated and vomited once.  She also felt lightheaded like she might pass out.  She took 2 baby aspirin and called her husband and came to the emergency room immediately.  It is now been approximately 2 hours since the start of her symptoms.  The pain is minimal now and she is not feeling it in her neck or back anymore.  She denies any shortness of breath at this time.  No numbness or tingling of her fingers or legs.  No radiation of the pain down into her abdomen.  She was feeling her normal self prior to the symptoms starting today.  She was sitting at her computer in a meeting.  She had not recently eaten or drank anything.  She does take Synthroid but no other medications.  She denies any alcohol, tobacco or drug use.  No family history of early MIs or family history of connective tissue diseases.  The history is provided by the patient.  Chest Pain     Past Medical History:  Diagnosis Date   Abnormal Pap smear of cervix 2015   Anxiety    Back pain    Gallbladder disease    Gallbladder problem    HTN (hypertension)    Hypothyroidism    Joint pain    PCOS (polycystic ovarian syndrome)    Sciatica    Thyroid disease    Vitamin D deficiency     There are no problems to display for this patient.   Past Surgical History:  Procedure Laterality Date   CHOLECYSTECTOMY  2010    DILATION AND CURETTAGE OF UTERUS  2015   OOPHORECTOMY     WISDOM TOOTH EXTRACTION  2001     OB History     Gravida  2   Para      Term      Preterm      AB  1   Living  1      SAB      IAB  1   Ectopic      Multiple      Live Births              Family History  Problem Relation Age of Onset   Hypertension Mother    Obesity Mother    Hypertension Father    Alcoholism Father     Social History   Tobacco Use   Smoking status: Never   Smokeless tobacco: Never  Vaping Use   Vaping Use: Never used  Substance Use Topics   Alcohol use: No   Drug use: No    Home Medications Prior to Admission medications   Medication Sig Start Date End Date Taking? Authorizing Provider  benzonatate (TESSALON) 100 MG capsule Take 1-2 capsules (100-200 mg total) by mouth 3 (three) times daily as needed for cough. 03/12/19   McVey,  Madelaine Bhat, PA-C  buPROPion [facility] SR) 150 MG 12 hr tablet Take 1 tablet (150 mg total) by mouth daily. 06/19/18   Quillian Quince D, MD  Cholecalciferol 4000 units CAPS Take 1 capsule by mouth daily.    [provider]  levothyroxine (SYNTHROID, LEVOTHROID) 150 MCG tablet 1 TABLET ON AN EMPTY STOMACH IN THE MORNING ONCE A DAY 09/06/16   [provider]  loratadine (CLARITIN) 10 MG tablet Take 10 mg by mouth daily.    [provider]  Melatonin 3 MG TABS Take 1 tablet by mouth at bedtime.    [provider]  Norethindrone-Ethinyl Estradiol-Fe Biphas (LO LOESTRIN FE) 1 MG-10 MCG / 10 MCG tablet Take 1 tablet by mouth daily.    [provider]  promethazine-dextromethorphan (PROMETHAZINE-DM) 6.25-15 MG/5ML syrup Take 5 mLs by mouth 4 (four) times daily as needed for cough. 03/12/19   McVey, Madelaine Bhat, PA-C  propranolol (INDERAL) 60 MG tablet Take 60 mg by mouth at bedtime.    [provider]    Allergies    Patient has no known allergies.  Review of Systems   Review of  Systems  Cardiovascular:  Positive for chest pain.  All other systems reviewed and are negative.  Physical Exam Updated Vital Signs BP (!) 145/94 (BP Location: Right Arm)   Pulse 84   Temp 99.2 F (37.3 C)   Resp 18   LMP 11/22/2020 (Approximate)   SpO2 100%   Physical Exam Vitals and nursing note reviewed.  Constitutional:      General: She is not in acute distress.    Appearance: Normal appearance. She is well-developed.  HENT:     Head: Normocephalic and atraumatic.     Nose: Nose normal.     Mouth/Throat:     Mouth: Mucous membranes are moist.  Eyes:     Pupils: Pupils are equal, round, and reactive to light.  Cardiovascular:     Rate and Rhythm: Normal rate and regular rhythm.     Pulses: Normal pulses.     Heart sounds: Normal heart sounds. No murmur heard.   No friction rub.  Pulmonary:     Effort: Pulmonary effort is normal.     Breath sounds: Normal breath sounds. No wheezing or rales.  Abdominal:     General: Bowel sounds are normal. There is no distension.     Palpations: Abdomen is soft.     Tenderness: There is no abdominal tenderness. There is no right CVA tenderness, left CVA tenderness, guarding or rebound.  Musculoskeletal:        General: No tenderness. Normal range of motion.     Right lower leg: No edema.     Left lower leg: No edema.     Comments: No edema  Skin:    General: Skin is warm and dry.     Findings: No rash.  Neurological:     Mental Status: She is alert and oriented to person, place, and time. Mental status is at baseline.     Cranial Nerves: No cranial nerve deficit.  Psychiatric:        Mood and Affect: Mood normal.        Behavior: Behavior normal.    ED Results / Procedures / Treatments   Labs (all labs ordered are listed, but only abnormal results are displayed) Labs Reviewed  RESP PANEL BY RT-PCR (FLU A&B, COVID) ARPGX2  BASIC METABOLIC PANEL  CBC  PREGNANCY, URINE  TROPONIN I (HIGH SENSITIVITY)  TROPONIN I (HIGH  SENSITIVITY)    EKG EKG Interpretation  Date/Time:  Wednesday December 03 2020 16:41:17 EDT Ventricular Rate:  98 PR Interval:  134 QRS Duration: 72 QT Interval:  322 QTC Calculation: 411 R Axis:   36 Text Interpretation: Normal sinus rhythm T wave abnormality, consider inferior ischemia T wave abnormality, consider anterolateral ischemia No previous tracing Confirmed by Gwyneth SproutPlunkett, Kambry Takacs ([id number]) on 12/03/2020 5:50:41 PM  Radiology DG Chest 2 View  Result Date: 12/03/2020 CLINICAL DATA:  35 year old female with chest pain. EXAM: CHEST - 2 VIEW COMPARISON:  None. FINDINGS: The heart size and mediastinal contours are within normal limits. Both lungs are clear. The visualized skeletal structures are unremarkable. IMPRESSION: No active cardiopulmonary disease. Electronically Signed   By: Elgie CollardArash  Radparvar M.D.   On: 12/03/2020 17:18   CT Angio Chest/Abd/Pel for Dissection W and/or Wo Contrast  Result Date: 12/03/2020 CLINICAL DATA:  Chest pain or back pain, aortic dissection suspected EXAM: CT ANGIOGRAPHY CHEST, ABDOMEN AND PELVIS TECHNIQUE: Multidetector CT imaging through the chest, abdomen and pelvis was performed using the standard protocol during bolus administration of intravenous contrast. Multiplanar reconstructed images and MIPs were obtained and reviewed to evaluate the vascular anatomy. CONTRAST:  100mL OMNIPAQUE IOHEXOL 350 MG/ML SOLN COMPARISON:  Abdomen pelvis CT 10/14/2016 FINDINGS: CTA CHEST FINDINGS Cardiovascular: Normal cardiac size. No pericardial disease. The thoracic aorta is unremarkable without evidence of aneurysm, dissection, or penetrating ulcer. There is no pulmonary embolism. Mediastinum/Nodes: No mediastinal, hilar, or axillary lymphadenopathy. Lungs/Pleura: No focal airspace consolidation. There are no suspicious pulmonary nodules or masses. No pleural effusion or pneumothorax. Musculoskeletal: No acute osseous abnormality. No suspicious lytic or blastic lesions. Review of  the MIP images confirms the above findings. CTA ABDOMEN AND PELVIS FINDINGS VASCULAR Aorta: Normal caliber aorta without aneurysm, dissection, vasculitis or significant stenosis. Celiac: Patent without evidence of aneurysm, dissection, vasculitis or significant stenosis. SMA: Patent without evidence of aneurysm, dissection, vasculitis or significant stenosis. Renals: Both renal arteries are patent without evidence of aneurysm, dissection, vasculitis, fibromuscular dysplasia or significant stenosis. IMA: Patent without evidence of aneurysm, dissection, vasculitis or significant stenosis. Inflow: Patent without evidence of aneurysm, dissection, vasculitis or significant stenosis. Veins: No obvious venous abnormality within the limitations of this arterial phase study. Review of the MIP images confirms the above findings. NON-VASCULAR Hepatobiliary: No focal liver abnormality is seen. Prior cholecystectomy. Pancreas: Unremarkable. No pancreatic ductal dilatation or surrounding inflammatory changes. Spleen: Normal in size without focal abnormality. Adrenals/Urinary Tract: Adrenal glands are unremarkable. Kidneys are normal, without renal calculi, focal lesion, or hydronephrosis. Bladder is mildly distended but unremarkable. Stomach/Bowel: Small hiatal hernia. Appendix appears normal. No evidence of bowel wall thickening, distention, or inflammatory changes. Vascular/Lymphatic: No significant vascular findings are present. No enlarged abdominal or pelvic lymph nodes. Reproductive: Postsurgical changes of left dermoid resection. There is a definable 1.5 cm fat containing lesion with coarse calcification in the anterior pelvis (axial image 269). Other: No abdominal wall hernia or abnormality. No abdominopelvic ascites. Musculoskeletal: No acute osseous abnormality. No suspicious lytic or blastic lesions. Mild bilateral hip degenerative changes. Left gluteus intramuscular lipoma. Review of the MIP images confirms the above  findings. IMPRESSION: No acute aortic pathology. No acute findings in the chest, abdomen, or pelvis. Small hiatal hernia. 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 There is a definable 1.5 cm fat containing lesion with coarse calcification in the anterior pelvis, which is favored represent postsurgical change/fat necrosis, however residual or recurrent dermoid is possible. Recommend follow-up CT pelvis  in 6 months to assess for stability. Electronically Signed   By: Caprice Renshaw   On: 12/03/2020 19:52    Procedures Procedures   Medications Ordered in ED Medications - No data to display  ED Course  I have reviewed the triage vital signs and the nursing notes.  Pertinent labs & imaging results that were available during my care of the patient were reviewed by me and considered in my medical decision making (see chart for details).    MDM Rules/Calculators/A&P                           Patient is a 35 year old female presenting with sudden onset of chest pain today concerning for ACS versus dissection.  It occurred at rest and has been persistent but after taking 2 aspirin she reports the pain is now much more tolerable but still present.  No prior history of any cardiac abnormalities.  She does not use drugs or alcohol.  EKG today does show T wave inversions in inferior lateral leads without old to compare.  Patient has equal pulses bilaterally denies any recent immobilization or surgeries.  No unilateral leg pain or swelling.  She does not take OCPs.  No recent infectious symptoms.  Given patient's history will rule out dissection with a CTA.  Initial troponin and labs are within normal limits.  11:42 PM Delta troponin is normal and CTA negative for any type of dissection or PE.  Repeat EKG is unchanged and when further evaluating patient did have a EKG in 2019 that showed similar findings.  Patient reports her discomfort has almost completely resolved.  Unclear source at this time.   Possibly esophageal spasm.  No findings to suggest ACS, dissection, PE today.  We will discharge patient home with follow-up with PCP.  Also noted in the left lower quadrant there is potential postsurgical remodeling versus fat necrosis versus recurrent dermoid cyst.  Patient will follow up with her PCP in 6 months for repeat evaluation.  MDM   Amount and/or Complexity of Data Reviewed Clinical lab tests: ordered and reviewed Tests in the radiology section of CPT: ordered and reviewed Tests in the medicine section of CPT: ordered and reviewed Independent visualization of images, tracings, or specimens: yes     Final Clinical Impression(s) / ED Diagnoses Final diagnoses:  Nonspecific chest pain    Rx / DC Orders ED Discharge Orders     None        Gwyneth Sprout, MD 12/03/20 2343

## 2020-12-03 NOTE — Discharge Instructions (Addendum)
All your testing today look normal.  No evidence of any thing wrong with your aorta.  Also your heart markers were normal.  Your EKG looks the same as it did in 2019.  No signs of heart attack.  You do have a small area in your pelvis where you had your left ovary taken out that they recommend you need follow-up on in 6 months.

## 2020-12-03 NOTE — ED Notes (Signed)
ED Provider at bedside. 

## 2020-12-03 NOTE — ED Triage Notes (Signed)
Patient reports she was sitting in a meeting when a sudden onset of chest pain hit her and kept stabbing in her chest. Patient reports she has never had anything like this happen before.

## 2020-12-03 NOTE — ED Notes (Signed)
Approx 1600hrs today while at home in a [employer] develop chest pressure,which radiated across her chest, became dizzy, pain then radiated to the middle of her back and up her neck bilaterally, states she became nauseated and had one vomiting episode

## 2020-12-23 ENCOUNTER — Telehealth: Payer: Self-pay

## 2020-12-23 NOTE — Telephone Encounter (Signed)
Referral notes received from [facility], Phone #: [phone], Fax #: [phone]   A copy of the notes have been placed in the scheduling box for check-out to pick-up and to enter referral. Original notes placed in file cabinet.

## 2021-03-16 NOTE — Progress Notes (Signed)
Cardiology Office Note:    Date:  03/30/2021   ID:  EVANGELINA Vargas, DOB [birth year], MRN [id number]  PCP:  Levonne Lapping, NP (Inactive)   [facility] Providers Cardiologist:  None {   Referring MD: Ranae Pila, *     History of Present Illness:    Cathy Vargas is a 35 y.o. female with a hx of anxiety, HTN, hypothyroidism,  and PCOS who was referred by Dr. Elon Spanner for further evaluation of chest pressure.  Patient was seen in [facility] on 12/03/20 with severe chest pain that radiated to her jaw and back with associated nausea and lightheadedness. Trop negative x2. ECG with NSR with diffuse T-wave flattening/inversion (similar to prior). CTA without evidence of dissection or PE. No significant coronary calcification. Patient referred to Cardiology for further management.  Today, the patient states that in July, she was working when she had sudden onset of chest pain that radiated to her jaw and both arms. Had associated lightheaded, nausea and vomiting. She went to [facility] ER where work-up showed chronic ECG changes as above but otherwise unremarkable.  Since that time, she has had 3 other episodes of chest pressure/tighthness that radiates to her neck. Each episode lasts about and then resolves on it own. She states that she has underlying anxiety, however, her symptoms do not seem to correlate with stress or anxiety. Denies exertional symptoms, SOB, orthopnea or PND. She is otherwise healthy with no known cardiac disease.   Family history: Maternal GF:CHF; Maternal GM: CVA. Maternal Aunt:? [place]  Past Medical History:  Diagnosis Date   Abnormal Pap smear of cervix 2015   Anxiety    Back pain    Gallbladder disease    Gallbladder problem    HTN (hypertension)    Hypothyroidism    Joint pain    PCOS (polycystic ovarian syndrome)    Sciatica    Thyroid disease    Vitamin D deficiency     Past Surgical History:  Procedure Laterality Date   CHOLECYSTECTOMY  2010    DILATION AND CURETTAGE OF UTERUS  2015   OOPHORECTOMY     WISDOM TOOTH EXTRACTION  2001    Current Medications: Current Meds  Medication Sig   levothyroxine (SYNTHROID, LEVOTHROID) 150 MCG tablet 1 TABLET ON AN EMPTY STOMACH IN THE MORNING ONCE A DAY   loratadine (CLARITIN) 10 MG tablet Take 10 mg by mouth daily.   Norethindrone-Ethinyl Estradiol-Fe Biphas (LO LOESTRIN FE) 1 MG-10 MCG / 10 MCG tablet Take 1 tablet by mouth daily.     Allergies:   Patient has no known allergies.   Social History   Socioeconomic History   Marital status: Married    Spouse name: Cathy Vargas   Number of children: 1   Years of education: Not on file   Highest education level: Not on file  Occupational History   Occupation: Lobbyist  Tobacco Use   Smoking status: Never   Smokeless tobacco: Never  Vaping Use   Vaping Use: Never used  Substance and Sexual Activity   Alcohol use: No   Drug use: No   Sexual activity: Yes  Other Topics Concern   Not on file  Social History Narrative   Not on file   Social Determinants of Health   Financial Resource Strain: Not on file  Food Insecurity: Not on file  Transportation Needs: Not on file  Physical Activity: Not on file  Stress: Not on file  Social Connections:  Not on file     Family History: The patient's family history includes Alcoholism in her father; Hypertension in her father and mother; Obesity in her mother.  ROS:   Please see the history of present illness.    Review of Systems  Constitutional:  Negative for chills and fever.  HENT:  Negative for sore throat.   Respiratory:  Negative for shortness of breath.   Cardiovascular:  Positive for chest pain. Negative for palpitations, orthopnea, claudication, leg swelling and PND.  Gastrointestinal:  Positive for nausea. Negative for blood in stool.  Genitourinary:  Negative for dysuria.  Musculoskeletal:  Negative for falls.  Neurological:  Negative for dizziness and  loss of consciousness.    EKGs/Labs/Other Studies Reviewed:    The following studies were reviewed today: CTA 12/19/2020: FINDINGS: CTA CHEST FINDINGS   Cardiovascular: Normal cardiac size. No pericardial disease. The thoracic aorta is unremarkable without evidence of aneurysm, dissection, or penetrating ulcer. There is no pulmonary embolism.   Mediastinum/Nodes: No mediastinal, hilar, or axillary lymphadenopathy.   Lungs/Pleura: No focal airspace consolidation. There are no suspicious pulmonary nodules or masses. No pleural effusion or pneumothorax.   Musculoskeletal: No acute osseous abnormality. No suspicious lytic or blastic lesions.   Review of the MIP images confirms the above findings.   CTA ABDOMEN AND PELVIS FINDINGS   VASCULAR   Aorta: Normal caliber aorta without aneurysm, dissection, vasculitis or significant stenosis.   Celiac: Patent without evidence of aneurysm, dissection, vasculitis or significant stenosis.   SMA: Patent without evidence of aneurysm, dissection, vasculitis or significant stenosis.   Renals: Both renal arteries are patent without evidence of aneurysm, dissection, vasculitis, fibromuscular dysplasia or significant stenosis.   IMA: Patent without evidence of aneurysm, dissection, vasculitis or significant stenosis.   Inflow: Patent without evidence of aneurysm, dissection, vasculitis or significant stenosis.   Veins: No obvious venous abnormality within the limitations of this arterial phase study.   Review of the MIP images confirms the above findings.   NON-VASCULAR   Hepatobiliary: No focal liver abnormality is seen. Prior cholecystectomy.   Pancreas: Unremarkable. No pancreatic ductal dilatation or surrounding inflammatory changes.   Spleen: Normal in size without focal abnormality.   Adrenals/Urinary Tract: Adrenal glands are unremarkable. Kidneys are normal, without renal calculi, focal lesion, or  hydronephrosis. Bladder is mildly distended but unremarkable.   Stomach/Bowel: Small hiatal hernia. Appendix appears normal. No evidence of bowel wall thickening, distention, or inflammatory changes.   Vascular/Lymphatic: No significant vascular findings are present. No enlarged abdominal or pelvic lymph nodes.   Reproductive: Postsurgical changes of left dermoid resection. There is a definable 1.5 cm fat containing lesion with coarse calcification in the anterior pelvis (axial image 269).   Other: No abdominal wall hernia or abnormality. No abdominopelvic ascites.   Musculoskeletal: No acute osseous abnormality. No suspicious lytic or blastic lesions. Mild bilateral hip degenerative changes. Left gluteus intramuscular lipoma.   Review of the MIP images confirms the above findings.   IMPRESSION: No acute aortic pathology. No acute findings in the chest, abdomen, or pelvis.   Small hiatal hernia.   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 There is a definable 1.5 cm fat containing lesion with coarse calcification in the anterior pelvis, which is favored represent postsurgical change/fat necrosis, however residual or recurrent dermoid is possible. Recommend follow-up CT pelvis in 6 months to assess for stability.    EKG:  EKG on 19-Dec-2020 with NSR, T wave flattening and inversions in inferior leads (chronic)  Recent Labs: 12/03/2020: BUN 9; Creatinine, Ser 0.72; Hemoglobin 14.2; Platelets 233; Potassium 3.6; Sodium 139  Recent Lipid Panel    Component Value Date/Time   CHOL 174 05/29/2018 1049   TRIG 147 05/29/2018 1049   HDL 42 05/29/2018 1049   LDLCALC 103 (H) 05/29/2018 1049          Physical Exam:    VS:  BP (!) 154/100   Pulse 100   Ht 5\' 8"  (1.727 m)   Wt 248 lb (112.5 kg)   SpO2 97%   BMI 37.71 kg/m     Wt Readings from Last 3 Encounters:  03/30/21 248 lb (112.5 kg)  06/19/18 232 lb (105.2 kg)  05/29/18 235 lb (106.6 kg)     GEN:   Well nourished, well developed in no acute distress HEENT: Normal NECK: No JVD; No carotid bruits LYMPHATICS: No lymphadenopathy CARDIAC: RRR, no murmurs, rubs, gallops RESPIRATORY:  Clear to auscultation without rales, wheezing or rhonchi  ABDOMEN: Soft, non-tender, non-distended MUSCULOSKELETAL:  No edema; No deformity  SKIN: Warm and dry NEUROLOGIC:  Alert and oriented x 3 PSYCHIATRIC:  Normal affect   ASSESSMENT:    1. Chest pain of uncertain etiology   2. Elevated blood-pressure reading, without diagnosis of hypertension   3. Class 2 obesity without serious comorbidity with body mass index (BMI) of 37.0 to 37.9 in adult, unspecified obesity type    PLAN:    In order of problems listed above:  #Chest Pain: Patient presented to the ER with severe substernal chest pain in 11/2020 with reassuring work-up. Specifically, trop negative x2, CTA without dissection or acute pathology. No significant coronary calcium. ECG with TWI inferiorly and diffuse T-wave flattening but this is unchanged from 2019. Has had 3 episodes since that time. Symptoms are not exertional or positional. Does not sound like typical angina and young age is reassuring. Will check TTE to ensure no structural abnormalities and Ca score for risk strafitication.  -Check TTE -Check Ca score  #Elevated Blood Pressure: States it is always elevated when coming to the doctor. Mainly running 120s at home. She will monitor and let us know if above goal of <120s/80s. -Monitor blood pressures at home and will call if >120s/80s  #Obesity: BMI 37. Very motivated to lose weight. -Patient is looking to re-establish care with healthy weight and wellness clinic -Will consider ozempic/wegovy           Medication Adjustments/Labs and Tests Ordered: Current medicines are reviewed at length with the patient today.  Concerns regarding medicines are outlined above.  Orders Placed This Encounter  Procedures   CT CARDIAC SCORING  (SELF PAY ONLY)   ECHOCARDIOGRAM COMPLETE   No orders of the defined types were placed in this encounter.   Patient Instructions  Medication Instructions:   Your physician recommends that you continue on your current medications as directed. Please refer to the Current Medication list given to you today.  *If you need a refill on your cardiac medications before your next appointment, please call your pharmacy*   Testing/Procedures:  Your physician has requested that you have an echocardiogram. Echocardiography is a painless test that uses sound waves to create images of your heart. It provides your doctor with information about the size and shape of your heart and how well your heart's chambers and valves are working. This procedure takes approximately one hour. There are no restrictions for this procedure.  CALCIUM SCORE DONE HERE IN THE OFFICE--SELF PAY   Follow-Up: At  [facility], you and your health needs are our priority.  As part of our continuing mission to provide you with exceptional heart care, we have created designated Provider Care Teams.  These Care Teams include your primary Cardiologist (physician) and Advanced Practice Providers (APPs -  Physician Assistants and Nurse Practitioners) who all work together to provide you with the care you need, when you need it.  We recommend signing up for the patient portal called "MyChart".  Sign up information is provided on this After Visit Summary.  MyChart is used to connect with patients for Virtual Visits (Telemedicine).  Patients are able to view lab/test results, encounter notes, upcoming appointments, etc.  Non-urgent messages can be sent to your provider as well.   To learn more about what you can do with MyChart, go to [email or web address].    Your next appointment:   1 year(s)  The format for your next appointment:   In Person  Provider:    DR. Johney Frame     Signed, Freada Bergeron, MD  03/30/2021 5:16  PM    [place]

## 2021-03-30 ENCOUNTER — Encounter: Payer: Self-pay | Admitting: Cardiology

## 2021-03-30 ENCOUNTER — Other Ambulatory Visit: Payer: Self-pay

## 2021-03-30 ENCOUNTER — Ambulatory Visit (INDEPENDENT_AMBULATORY_CARE_PROVIDER_SITE_OTHER): Payer: 59 | Admitting: Cardiology

## 2021-03-30 VITALS — BP 154/100 | HR 100 | Ht 68.0 in | Wt 248.0 lb

## 2021-03-30 DIAGNOSIS — E669 Obesity, unspecified: Secondary | ICD-10-CM

## 2021-03-30 DIAGNOSIS — R030 Elevated blood-pressure reading, without diagnosis of hypertension: Secondary | ICD-10-CM | POA: Diagnosis not present

## 2021-03-30 DIAGNOSIS — Z6837 Body mass index (BMI) 37.0-37.9, adult: Secondary | ICD-10-CM

## 2021-03-30 DIAGNOSIS — R079 Chest pain, unspecified: Secondary | ICD-10-CM | POA: Diagnosis not present

## 2021-03-30 NOTE — Patient Instructions (Signed)
Medication Instructions:   Your physician recommends that you continue on your current medications as directed. Please refer to the Current Medication list given to you today.  *If you need a refill on your cardiac medications before your next appointment, please call your pharmacy*   Testing/Procedures:  Your physician has requested that you have an echocardiogram. Echocardiography is a painless test that uses sound waves to create images of your heart. It provides your doctor with information about the size and shape of your heart and how well your heart's chambers and valves are working. This procedure takes approximately one hour. There are no restrictions for this procedure.  CALCIUM SCORE DONE HERE IN THE OFFICE--SELF PAY   Follow-Up: At [facility], you and your health needs are our priority.  As part of our continuing mission to provide you with exceptional heart care, we have created designated Provider Care Teams.  These Care Teams include your primary Cardiologist (physician) and Advanced Practice Providers (APPs -  Physician Assistants and Nurse Practitioners) who all work together to provide you with the care you need, when you need it.  We recommend signing up for the patient portal called "MyChart".  Sign up information is provided on this After Visit Summary.  MyChart is used to connect with patients for Virtual Visits (Telemedicine).  Patients are able to view lab/test results, encounter notes, upcoming appointments, etc.  Non-urgent messages can be sent to your provider as well.   To learn more about what you can do with MyChart, go to [email or web address].    Your next appointment:   1 year(s)  The format for your next appointment:   In Person  Provider:    DR. Shari Prows

## 2021-04-06 LAB — HM PAP SMEAR: HPV, high-risk: NEGATIVE

## 2021-04-07 ENCOUNTER — Other Ambulatory Visit: Payer: Self-pay

## 2021-04-07 ENCOUNTER — Encounter (HOSPITAL_BASED_OUTPATIENT_CLINIC_OR_DEPARTMENT_OTHER): Payer: Self-pay | Admitting: *Deleted

## 2021-04-07 DIAGNOSIS — W540XXA Bitten by dog, initial encounter: Secondary | ICD-10-CM | POA: Insufficient documentation

## 2021-04-07 DIAGNOSIS — Z79899 Other long term (current) drug therapy: Secondary | ICD-10-CM | POA: Insufficient documentation

## 2021-04-07 DIAGNOSIS — S51831A Puncture wound without foreign body of right forearm, initial encounter: Secondary | ICD-10-CM | POA: Diagnosis not present

## 2021-04-07 DIAGNOSIS — E669 Obesity, unspecified: Secondary | ICD-10-CM | POA: Diagnosis not present

## 2021-04-07 DIAGNOSIS — S60222A Contusion of left hand, initial encounter: Secondary | ICD-10-CM | POA: Diagnosis not present

## 2021-04-07 DIAGNOSIS — I10 Essential (primary) hypertension: Secondary | ICD-10-CM | POA: Diagnosis not present

## 2021-04-07 DIAGNOSIS — E039 Hypothyroidism, unspecified: Secondary | ICD-10-CM | POA: Diagnosis not present

## 2021-04-07 DIAGNOSIS — S51851A Open bite of right forearm, initial encounter: Secondary | ICD-10-CM | POA: Diagnosis present

## 2021-04-07 NOTE — ED Triage Notes (Signed)
Pt reports her dog bit her arm. Multiple small lacs to the right forearm and to the left hand. Areas cleansed by tech in triage. Animal was vaccinated.

## 2021-04-08 ENCOUNTER — Emergency Department (HOSPITAL_BASED_OUTPATIENT_CLINIC_OR_DEPARTMENT_OTHER): Payer: 59 | Admitting: Radiology

## 2021-04-08 ENCOUNTER — Emergency Department (HOSPITAL_BASED_OUTPATIENT_CLINIC_OR_DEPARTMENT_OTHER)
Admission: EM | Admit: 2021-04-08 | Discharge: 2021-04-08 | Disposition: A | Discharge status: Home / Self Care | Payer: 59 | Attending: Emergency Medicine | Admitting: Emergency Medicine

## 2021-04-08 DIAGNOSIS — W540XXA Bitten by dog, initial encounter: Secondary | ICD-10-CM

## 2021-04-08 MED ORDER — AMOXICILLIN-POT CLAVULANATE 875-125 MG PO TABS
1.0000 | ORAL_TABLET | Freq: Once | ORAL | Status: AC
  Administered 2021-04-08: 1 via ORAL
  Filled 2021-04-08: qty 1

## 2021-04-08 MED ORDER — AMOXICILLIN-POT CLAVULANATE 875-125 MG PO TABS: 1.0000 | ORAL_TABLET | Freq: Two times a day (BID) | ORAL | 0 refills | Status: DC

## 2021-04-08 NOTE — ED Notes (Signed)
Last tetanus 2019 per patient.

## 2021-04-08 NOTE — ED Provider Notes (Signed)
[facility] EMERGENCY DEPT Provider Note   CSN: [id number] Arrival date & time: 04/07/21  2230     History Chief Complaint  Patient presents with   Animal Bite    Cathy Vargas is a 35 y.o. female.  HPI     This is a 35 year old female with a history of hypertension and hypothyroidism who presents with a dog bite.  Patient reports that her own dog bit her unprovoked.  He has been aggressive with other dogs in the past but not with people.  She states that he is up-to-date on his vaccinations including rabies.  Her last tetanus was in 2019.  She has injury to the right forearm into the left hand.  She is left-handed.  She denies significant pain.  Past Medical History:  Diagnosis Date   Abnormal Pap smear of cervix 2015   Anxiety    Back pain    Gallbladder disease    Gallbladder problem    HTN (hypertension)    Hypothyroidism    Joint pain    PCOS (polycystic ovarian syndrome)    Sciatica    Thyroid disease    Vitamin D deficiency     There are no problems to display for this patient.   Past Surgical History:  Procedure Laterality Date   CHOLECYSTECTOMY  2010   DILATION AND CURETTAGE OF UTERUS  2015   OOPHORECTOMY     WISDOM TOOTH EXTRACTION  2001     OB History     Gravida  2   Para      Term      Preterm      AB  1   Living  1      SAB      IAB  1   Ectopic      Multiple      Live Births              Family History  Problem Relation Age of Onset   Hypertension Mother    Obesity Mother    Hypertension Father    Alcoholism Father     Social History   Tobacco Use   Smoking status: Never   Smokeless tobacco: Never  Vaping Use   Vaping Use: Never used  Substance Use Topics   Alcohol use: No   Drug use: No    Home Medications Prior to Admission medications   Medication Sig Start Date End Date Taking? Authorizing Provider  amoxicillin-clavulanate (AUGMENTIN) 875-125 MG tablet Take 1 tablet by mouth every  12 (twelve) hours. 04/08/21  Yes Mykel Mohl, Mayer Masker, MD  levothyroxine (SYNTHROID, LEVOTHROID) 150 MCG tablet 1 TABLET ON AN EMPTY STOMACH IN THE MORNING ONCE A DAY 09/06/16   [provider]  loratadine (CLARITIN) 10 MG tablet Take 10 mg by mouth daily.    [provider]  Norethindrone-Ethinyl Estradiol-Fe Biphas (LO LOESTRIN FE) 1 MG-10 MCG / 10 MCG tablet Take 1 tablet by mouth daily.    [provider]    Allergies    Patient has no known allergies.  Review of Systems   Review of Systems  Constitutional:  Negative for fever.  Skin:  Positive for wound.  All other systems reviewed and are negative.  Physical Exam Updated Vital Signs BP (!) 151/96 (BP Location: Right Arm)   Pulse 97   Temp 98.2 F (36.8 C)   Resp 19   Ht 1.727 m (5\' 8" )   Wt 111.1 kg   SpO2 100%  BMI 37.25 kg/m   Physical Exam Vitals and nursing note reviewed.  Constitutional:      Appearance: She is well-developed. She is obese. She is not ill-appearing.  HENT:     Head: Normocephalic and atraumatic.     Nose: Nose normal.     Mouth/Throat:     Mouth: Mucous membranes are moist.  Eyes:     Pupils: Pupils are equal, round, and reactive to light.  Cardiovascular:     Rate and Rhythm: Normal rate and regular rhythm.  Pulmonary:     Effort: Pulmonary effort is normal. No respiratory distress.  Abdominal:     Palpations: Abdomen is soft.  Musculoskeletal:        General: No deformity.     Comments: Ecchymosis noted of the dorsum of the left hand, no obvious deformities Flexion and extension of all 5 digits of the bilateral hands intact, 2+ radial pulse bilaterally  Skin:    General: Skin is warm and dry.     Comments: Multiple puncture wounds about the right forearm ranging from 0.3 to 1 cm in length, puncture wound noted to the medial aspect of the dorsum of the left hand  Neurological:     Mental Status: She is alert and oriented to person, place, and time.   Psychiatric:        Mood and Affect: Mood normal.    ED Results / Procedures / Treatments   Labs (all labs ordered are listed, but only abnormal results are displayed) Labs Reviewed - No data to display  EKG None  Radiology DG Hand Complete Left  Result Date: 04/08/2021 CLINICAL DATA:  Dog bite EXAM: LEFT HAND - COMPLETE 3+ VIEW COMPARISON:  None. FINDINGS: There is no acute osseous abnormality. There is no retained radiopaque foreign body. Small amount of soft tissue gas at the dorsum of the hand. IMPRESSION: No acute osseous abnormality or retained radiopaque foreign body. Electronically Signed   By: Deatra Robinson M.D.   On: 04/08/2021 02:11    Procedures Procedures   Medications Ordered in ED Medications  amoxicillin-clavulanate (AUGMENTIN) 875-125 MG per tablet 1 tablet (has no administration in time range)    ED Course  I have reviewed the triage vital signs and the nursing notes.  Pertinent labs & imaging results that were available during my care of the patient were reviewed by me and considered in my medical decision making (see chart for details).    MDM Rules/Calculators/A&P                           Patient presents with dog bite to the right forearm and left hand.  She has multiple puncture wounds.  There are no large gaping wounds.  Most of the wounds are subcentimeter.  Given the location of the wounds at high rate of infection, discussed leaving the wounds open as none of them are actively bleeding.  They will likely heal without difficulty.  Wounds were copiously irrigated at the bedside.  She was given a dose of Augmentin.  Will discharge on Augmentin.  She was advised of the increased risk of infection with dog bites.  Patient stated understanding and will return if worsening.  Given bruising noted on left hand, x-rays obtained.  No obvious fracture.  After history, exam, and medical workup I feel the patient has been appropriately medically screened and is  safe for discharge home. Pertinent diagnoses were discussed with the patient. Patient  was given return precautions.  Final Clinical Impression(s) / ED Diagnoses Final diagnoses:  Dog bite, initial encounter    Rx / DC Orders ED Discharge Orders          Ordered    amoxicillin-clavulanate (AUGMENTIN) 875-125 MG tablet  Every 12 hours        04/08/21 0231             Ilhan Madan, Mayer Masker, MD 04/08/21 [phone]

## 2021-04-08 NOTE — Discharge Instructions (Signed)
You were seen today for dog bite.  Your wounds were not closed because they are fairly superficial.  You are at high risk for infection.  Take antibiotics as prescribed.  If you note drainage, redness, any new or worsening symptoms, you should be reevaluated.

## 2021-04-20 ENCOUNTER — Other Ambulatory Visit: Payer: Self-pay

## 2021-04-20 ENCOUNTER — Encounter: Payer: Self-pay | Admitting: Family

## 2021-04-20 ENCOUNTER — Ambulatory Visit (INDEPENDENT_AMBULATORY_CARE_PROVIDER_SITE_OTHER)
Admission: RE | Admit: 2021-04-20 | Discharge: 2021-04-20 | Disposition: A | Discharge status: Home / Self Care | Payer: Self-pay | Source: Ambulatory Visit | Attending: Cardiology | Admitting: Cardiology

## 2021-04-20 ENCOUNTER — Ambulatory Visit (HOSPITAL_COMMUNITY): Payer: 59 | Attending: Cardiology

## 2021-04-20 ENCOUNTER — Ambulatory Visit (INDEPENDENT_AMBULATORY_CARE_PROVIDER_SITE_OTHER): Payer: 59 | Admitting: Family

## 2021-04-20 VITALS — BP 122/90 | HR 91 | Temp 98.1°F | Ht 68.0 in | Wt 251.2 lb

## 2021-04-20 DIAGNOSIS — Z0000 Encounter for general adult medical examination without abnormal findings: Secondary | ICD-10-CM | POA: Insufficient documentation

## 2021-04-20 DIAGNOSIS — R079 Chest pain, unspecified: Secondary | ICD-10-CM

## 2021-04-20 DIAGNOSIS — E039 Hypothyroidism, unspecified: Secondary | ICD-10-CM | POA: Diagnosis not present

## 2021-04-20 DIAGNOSIS — S61459A Open bite of unspecified hand, initial encounter: Secondary | ICD-10-CM | POA: Insufficient documentation

## 2021-04-20 DIAGNOSIS — E669 Obesity, unspecified: Secondary | ICD-10-CM | POA: Insufficient documentation

## 2021-04-20 DIAGNOSIS — W540XXA Bitten by dog, initial encounter: Secondary | ICD-10-CM | POA: Diagnosis not present

## 2021-04-20 LAB — LIPID PANEL
Cholesterol: 198 mg/dL (ref 0–200)
HDL: 48.5 mg/dL (ref >39.00)
LDL Cholesterol: 115 mg/dL — ABNORMAL HIGH (ref 0–99)
NonHDL: 149.91
Total CHOL/HDL Ratio: 4
Triglycerides: 173 mg/dL — ABNORMAL HIGH (ref 0.0–149.0)
VLDL: 34.6 mg/dL (ref 0.0–40.0)

## 2021-04-20 LAB — TSH: TSH: 1.93 u[IU]/mL (ref 0.35–5.50)

## 2021-04-20 LAB — COMPREHENSIVE METABOLIC PANEL
ALT: 12 U/L (ref 0–35)
AST: 14 U/L (ref 0–37)
Albumin: 4.1 g/dL (ref 3.5–5.2)
Alkaline Phosphatase: 44 U/L (ref 39–117)
BUN: 10 mg/dL (ref 6–23)
CO2: 25 mEq/L (ref 19–32)
Calcium: 9.3 mg/dL (ref 8.4–10.5)
Chloride: 105 mEq/L (ref 96–112)
Creatinine, Ser: 0.78 mg/dL (ref 0.40–1.20)
GFR: 97.1 mL/min (ref >60.00)
Glucose, Bld: 95 mg/dL (ref 70–99)
Potassium: 4.5 mEq/L (ref 3.5–5.1)
Sodium: 140 mEq/L (ref 135–145)
Total Bilirubin: 0.3 mg/dL (ref 0.2–1.2)
Total Protein: 7 g/dL (ref 6.0–8.3)

## 2021-04-20 LAB — ECHOCARDIOGRAM COMPLETE
Area-P 1/2: 3.68 cm2
Height: 68 in
S' Lateral: 3.6 cm
Weight: 4019.2 oz

## 2021-04-20 LAB — CBC WITH DIFFERENTIAL/PLATELET
Basophils Absolute: 0 10*3/uL (ref 0.0–0.1)
Basophils Relative: 0.7 % (ref 0.0–3.0)
Eosinophils Absolute: 0.1 10*3/uL (ref 0.0–0.7)
Eosinophils Relative: 1.4 % (ref 0.0–5.0)
HCT: 40.6 % (ref 36.0–46.0)
Hemoglobin: 13.8 g/dL (ref 12.0–15.0)
Lymphocytes Relative: 25.6 % (ref 12.0–46.0)
Lymphs Abs: 1.2 10*3/uL (ref 0.7–4.0)
MCHC: 34.1 g/dL (ref 30.0–36.0)
MCV: 89.1 fl (ref 78.0–100.0)
Monocytes Absolute: 0.2 10*3/uL (ref 0.1–1.0)
Monocytes Relative: 5 % (ref 3.0–12.0)
Neutro Abs: 3.3 10*3/uL (ref 1.4–7.7)
Neutrophils Relative %: 67.3 % (ref 43.0–77.0)
Platelets: 212 10*3/uL (ref 150.0–400.0)
RBC: 4.55 Mil/uL (ref 3.87–5.11)
RDW: 12.7 % (ref 11.5–15.5)
WBC: 4.9 10*3/uL (ref 4.0–10.5)

## 2021-04-20 LAB — T4, FREE: Free T4: 0.85 ng/dL (ref 0.60–1.60)

## 2021-04-20 LAB — HM PAP SMEAR: HM Pap smear: NORMAL

## 2021-04-20 NOTE — Patient Instructions (Signed)
Welcome to [facility] [place]! It was a pleasure meeting you today.    PLEASE NOTE:  If you had any LAB tests please let us know if you have not heard back within a few days. You may see your results on MyChart before we have a chance to review them but we will give you a call once they are reviewed by us. If we ordered any REFERRALS today, please let us know if you have not heard from their office within the next week.  Let us know through MyChart if you are needing REFILLS, or have your pharmacy send us the request. You can also use MyChart to communicate with me or any office staff.  Please try these tips to maintain a healthy lifestyle:  Eat most of your calories during the day when you are active. Eliminate processed foods including packaged sweets (pies, cakes, cookies), reduce intake of potatoes, white bread, white pasta, and white rice. Look for whole grain options, oat flour or almond flour.  Each meal should contain half fruits/vegetables, one quarter protein, and one quarter carbs (no bigger than a computer mouse).  Cut down on sweet beverages. This includes juice, soda, and sweet tea. Also watch fruit intake, though this is a healthier sweet option, it still contains natural sugar! Limit to 3 servings daily.  Drink at least 1 glass of water with each meal and aim for at least 8 glasses per day  Exercise at least 150 minutes every week.   

## 2021-04-20 NOTE — Assessment & Plan Note (Signed)
DX 2012, same dose for last few years. Rechecking level today. No refill needed.

## 2021-04-20 NOTE — Assessment & Plan Note (Addendum)
Wt. Loss strategies reviewed including portion control, less carbs including sweets, eating most of calories earlier in day, drinking 64oz water qd, and establishing daily exercise routine. Pt was established with [facility] weight mgt clinic prior to pandemic, would like to reestablish after new year.

## 2021-04-20 NOTE — Progress Notes (Signed)
Phone [phone]   Subjective:   Patient is a 35 y.o. female presenting for annual physical.    Chief Complaint  Patient presents with   Establish Care   Annual Exam    With labs; Pt is fasting.    Animal Bite    She has a dog bite from last week, and would like it to be evaluated.     See problem oriented charting- ROS- full  review of systems was completed and negative except for: dog bite on her hand, Hypothyroidism. Skin Abrasion: Patient complains of a dog bite. The abrasion is located on the left hand(s): dorsal. The abrasion has been present 1 week.  Interventions to date:  seen by UC , told to keep clean w/soap & water, apply Neosporin qd. Hypothyroidism: Patient presents today for followup of Hypothyroidism.  Patient reports positive compliance with daily medication.  Patient denies any of the following symptoms: fatigue, cold intolerance, constipation, weight gain or inability to lose weight, muscle weakness, mental slowing, dry hair and skin. Last TSH and free T4: Lab Results  Component Value Date   FREE T4 1.11 05/29/2018   FREE T4 1.35 01/18/2018   TSH 5.990 (H) 05/29/2018   TSH 2.260 01/18/2018      The following were reviewed and entered/updated in epic: Past Medical History:  Diagnosis Date   Abnormal Pap smear of cervix 2015   Anxiety    Back pain    Gallbladder disease    Gallbladder problem    HTN (hypertension)    Joint pain    PCOS (polycystic ovarian syndrome)    Sciatica    Thyroid disease    Vitamin D deficiency    Patient Active Problem List   Diagnosis Date Noted   Obesity (BMI 30-39.9) 04/20/2021   Annual physical exam 04/20/2021   Acquired hypothyroidism 04/20/2021   Dog bite of hand without complication, initial encounter 04/20/2021   Past Surgical History:  Procedure Laterality Date   CHOLECYSTECTOMY  2010   DILATION AND CURETTAGE OF UTERUS  2015   OOPHORECTOMY     WISDOM TOOTH EXTRACTION  2001    Family History  Problem  Relation Age of Onset   Hypertension Mother    Obesity Mother    Hypertension Father    Alcoholism Father    Drug abuse Brother    Stroke Maternal Grandmother    Heart disease Maternal Grandfather    Cancer Paternal Grandfather     Medications- reviewed and updated Current Outpatient Medications  Medication Sig Dispense Refill   cholecalciferol (VITAMIN D3) 25 MCG (1000 UNIT) tablet Take 1,000 Units by mouth daily.     levothyroxine (SYNTHROID, LEVOTHROID) 150 MCG tablet 1 TABLET ON AN EMPTY STOMACH IN THE MORNING ONCE A DAY  0   loratadine (CLARITIN) 10 MG tablet Take 10 mg by mouth daily.     Norethindrone-Ethinyl Estradiol-Fe Biphas (LO LOESTRIN FE) 1 MG-10 MCG / 10 MCG tablet Take 1 tablet by mouth daily.     No current facility-administered medications for this visit.    Allergies-reviewed and updated No Known Allergies  Social History   Social History Narrative   Not on file   Objective  Objective:  BP 122/90   Pulse 91   Temp 98.1 F (36.7 C) (Temporal)   Ht 5\' 8"  (1.727 m)   Wt 251 lb 3.2 oz (113.9 kg)   LMP 04/06/2021 (Approximate)   SpO2 98%   BMI 38.19 kg/m  Gen: NAD, resting comfortably HEENT:  Mucous membranes are moist. Oropharynx normal Neck: no thyromegaly CV: RRR no murmurs rubs or gallops Lungs: CTAB no crackles, wheeze, rhonchi Abdomen: soft/nontender/nondistended/normal bowel sounds. No rebound or guarding.  Ext: no edema Skin: warm, dry; pt has approx. 0.3cm dog bite abrasion on left dorsal hand, mild erythema, no s/s of infection. Neuro: grossly normal, moves all extremities, PERRLA   Assessment and Plan   Health Maintenance counseling: 1. Anticipatory guidance: Patient counseled regarding regular dental exams q6 months, eye exams,  avoiding smoking and second hand smoke, limiting alcohol to 1 beverage per day, no illicit drugs.   2. Risk factor reduction:  Advised patient of need for regular exercise and diet rich and fruits and vegetables  to reduce risk of heart attack and stroke. Exercise- none.  Wt Readings from Last 3 Encounters:  04/20/21 251 lb 3.2 oz (113.9 kg)  04/07/21 245 lb (111.1 kg)  03/30/21 248 lb (112.5 kg)   3. Immunizations/screenings/ancillary studies Immunization History  Administered Date(s) Administered   PFIZER(Purple Top)SARS-COV-2 Vaccination 08/24/2019, 09/14/2019, 04/18/2020   Td 10/10/2017   Health Maintenance Due  Topic Date Due   HIV Screening  Never done   Hepatitis C Screening  Never done   PAP SMEAR-Modifier  09/02/2019   COVID-19 Vaccine (4 - Booster for Pfizer series) 06/13/2020    4. Cervical cancer screening: done last week via GYN. 5. Skin cancer screening- advised regular sunscreen use. Denies worrisome, changing, or new skin lesions.  6. Birth control/STD check: OCPs 7. Smoking associated screening: non- smoker 8. Alcohol screening: occ.  Problem List Items Addressed This Visit       Endocrine   Acquired hypothyroidism    DX 2012, same dose for last few years. Rechecking level today. No refill needed.      Relevant Orders   T4, free     Other   Obesity (BMI 30-39.9)    Wt. Loss strategies reviewed including portion control, less carbs including sweets, eating most of calories earlier in day, drinking 64oz water qd, and establishing daily exercise routine. Pt was established with [facility] prior to pandemic, would like to reestablish after new year.      Annual physical exam - Primary    Fasting. Hypothyroidism. PAP done 2 weeks ago. Married, one child, works F/T from home.       Relevant Orders   Comprehensive metabolic panel   TSH   Lipid panel   CBC with Differential/Platelet   Dog bite of hand without complication, initial encounter    Left anterior hand, mild erythema, continue to wash with soap & water, apply very small amount of Neosporin daily, cover if going out or washing hands frequently.       Recommended follow up: Return in about 6  months (around 10/19/2021) for med refill - thyroid. Future Appointments  Date Time Provider Department Center  04/20/2021  1:45 PM [facility] ECHO 3 [facility]  04/20/2021  2:45 PM [facility]-CT 1 [facility]  10/19/2021  3:30 PM Naylin Burkle, Judeth Cornfield, NP [facility]  04/21/2022  8:00 AM Dulce Sellar, NP [facility]    Lab/Order associations: fasting   ICD-10-CM   1. Annual physical exam  Z00.00 Comprehensive metabolic panel    TSH    Lipid panel    CBC with Differential/Platelet    2. Obesity (BMI 30-39.9)  E66.9     3. Acquired hypothyroidism  E03.9 T4, free    4. Dog bite of hand without complication, initial  encounter  [id number]    W54.Florian Buff, NP

## 2021-04-20 NOTE — Assessment & Plan Note (Addendum)
Fasting. Hypothyroidism. PAP done 2 weeks ago. Married, one child, works F/T from home.

## 2021-04-20 NOTE — Assessment & Plan Note (Signed)
Left anterior hand, mild erythema, continue to wash with soap & water, apply very small amount of Neosporin daily, cover if going out or washing hands frequently.

## 2021-04-21 ENCOUNTER — Telehealth: Payer: Self-pay | Admitting: Cardiology

## 2021-04-21 ENCOUNTER — Encounter: Payer: Self-pay | Admitting: Family

## 2021-04-21 NOTE — Telephone Encounter (Signed)
Message Received: Michele Rockers, MD  Loa Socks, LPN Echo shows normal pumping function with no significant valve disease.       The patient has been notified of both the echo result and Calcium score result, and verbalized understanding of both.  All questions (if any) were answered. Loa Socks, LPN 81/05/7508 25:85 AM

## 2021-04-21 NOTE — Telephone Encounter (Signed)
-----   Message from Meriam Sprague, MD sent at 04/20/2021  7:50 PM EST ----- Calcium score is mildly elevated at 3. This is in the 37% for age, race and sex matched controls. This means we need to keep a close eye on things but no need to start a statin at this time.

## 2021-04-21 NOTE — Telephone Encounter (Signed)
Patient returned call for test results.  °

## 2021-04-23 DIAGNOSIS — Z0289 Encounter for other administrative examinations: Secondary | ICD-10-CM

## 2021-05-20 ENCOUNTER — Ambulatory Visit (INDEPENDENT_AMBULATORY_CARE_PROVIDER_SITE_OTHER): Payer: 59 | Admitting: Family Medicine

## 2021-05-20 ENCOUNTER — Encounter (INDEPENDENT_AMBULATORY_CARE_PROVIDER_SITE_OTHER): Payer: Self-pay | Admitting: Family Medicine

## 2021-05-20 ENCOUNTER — Other Ambulatory Visit: Payer: Self-pay

## 2021-05-20 VITALS — BP 116/82 | HR 90 | Temp 98.2°F | Ht 68.0 in | Wt 249.0 lb

## 2021-05-20 DIAGNOSIS — E7849 Other hyperlipidemia: Secondary | ICD-10-CM | POA: Diagnosis not present

## 2021-05-20 DIAGNOSIS — R0602 Shortness of breath: Secondary | ICD-10-CM | POA: Diagnosis not present

## 2021-05-20 DIAGNOSIS — R5383 Other fatigue: Secondary | ICD-10-CM | POA: Diagnosis not present

## 2021-05-20 DIAGNOSIS — E7800 Pure hypercholesterolemia, unspecified: Secondary | ICD-10-CM

## 2021-05-20 DIAGNOSIS — E8881 Metabolic syndrome: Secondary | ICD-10-CM | POA: Diagnosis not present

## 2021-05-20 DIAGNOSIS — E039 Hypothyroidism, unspecified: Secondary | ICD-10-CM

## 2021-05-20 DIAGNOSIS — E559 Vitamin D deficiency, unspecified: Secondary | ICD-10-CM

## 2021-05-20 DIAGNOSIS — Z6837 Body mass index (BMI) 37.0-37.9, adult: Secondary | ICD-10-CM

## 2021-05-20 DIAGNOSIS — E669 Obesity, unspecified: Secondary | ICD-10-CM

## 2021-05-21 LAB — CBC WITH DIFFERENTIAL/PLATELET
Basophils Absolute: 0 10*3/uL (ref 0.0–0.2)
Basos: 0 %
EOS (ABSOLUTE): 0.1 10*3/uL (ref 0.0–0.4)
Eos: 1 %
Hematocrit: 40.3 % (ref 34.0–46.6)
Hemoglobin: 13.9 g/dL (ref 11.1–15.9)
Immature Grans (Abs): 0 10*3/uL (ref 0.0–0.1)
Immature Granulocytes: 0 %
Lymphocytes Absolute: 1.1 10*3/uL (ref 0.7–3.1)
Lymphs: 22 %
MCH: 29.9 pg (ref 26.6–33.0)
MCHC: 34.5 g/dL (ref 31.5–35.7)
MCV: 87 fL (ref 79–97)
Monocytes Absolute: 0.3 10*3/uL (ref 0.1–0.9)
Monocytes: 6 %
Neutrophils Absolute: 3.5 10*3/uL (ref 1.4–7.0)
Neutrophils: 71 %
Platelets: 209 10*3/uL (ref 150–450)
RBC: 4.65 x10E6/uL (ref 3.77–5.28)
RDW: 12.4 % (ref 11.7–15.4)
WBC: 5.1 10*3/uL (ref 3.4–10.8)

## 2021-05-21 LAB — VITAMIN B12: Vitamin B-12: 239 pg/mL (ref 232–1245)

## 2021-05-21 LAB — HEMOGLOBIN A1C
Est. average glucose Bld gHb Est-mCnc: 105 mg/dL
Hgb A1c MFr Bld: 5.3 % (ref 4.8–5.6)

## 2021-05-21 LAB — COMPREHENSIVE METABOLIC PANEL
ALT: 12 IU/L (ref 0–32)
AST: 11 IU/L (ref 0–40)
Albumin/Globulin Ratio: 1.8 (ref 1.2–2.2)
Albumin: 4.4 g/dL (ref 3.8–4.8)
Alkaline Phosphatase: 52 IU/L (ref 44–121)
BUN/Creatinine Ratio: 14 (ref 9–23)
BUN: 10 mg/dL (ref 6–20)
Bilirubin Total: 0.2 mg/dL (ref 0.0–1.2)
CO2: 22 mmol/L (ref 20–29)
Calcium: 9.1 mg/dL (ref 8.7–10.2)
Chloride: 103 mmol/L (ref 96–106)
Creatinine, Ser: 0.71 mg/dL (ref 0.57–1.00)
Globulin, Total: 2.4 g/dL (ref 1.5–4.5)
Glucose: 95 mg/dL (ref 70–99)
Potassium: 4 mmol/L (ref 3.5–5.2)
Sodium: 138 mmol/L (ref 134–144)
Total Protein: 6.8 g/dL (ref 6.0–8.5)
eGFR: 112 mL/min/{1.73_m2} (ref >59)

## 2021-05-21 LAB — FOLATE: Folate: 8.6 ng/mL (ref >3.0)

## 2021-05-21 LAB — LIPID PANEL
Chol/HDL Ratio: 4.3 ratio (ref 0.0–4.4)
Cholesterol, Total: 202 mg/dL — ABNORMAL HIGH (ref 100–199)
HDL: 47 mg/dL (ref >39)
LDL Chol Calc (NIH): 123 mg/dL — ABNORMAL HIGH (ref 0–99)
Triglycerides: 183 mg/dL — ABNORMAL HIGH (ref 0–149)
VLDL Cholesterol Cal: 32 mg/dL (ref 5–40)

## 2021-05-21 LAB — T4, FREE: Free T4: 1.2 ng/dL (ref 0.82–1.77)

## 2021-05-21 LAB — T3, FREE: T3, Free: 2.8 pg/mL (ref 2.0–4.4)

## 2021-05-21 LAB — TSH: TSH: 2.74 u[IU]/mL (ref 0.450–4.500)

## 2021-05-21 LAB — VITAMIN D 25 HYDROXY (VIT D DEFICIENCY, FRACTURES): Vit D, 25-Hydroxy: 38.8 ng/mL (ref 30.0–100.0)

## 2021-05-21 LAB — INSULIN, RANDOM: INSULIN: 27.7 u[IU]/mL — ABNORMAL HIGH (ref 2.6–24.9)

## 2021-05-21 NOTE — Progress Notes (Addendum)
Chief Complaint:   OBESITY Cathy Vargas (MR# [id number]) is a 36 y.o. female who presents for evaluation and treatment of obesity and related comorbidities. Current BMI is Body mass index is 37.86 kg/m. Cathy Vargas has been struggling with her weight for many years and has been unsuccessful in either losing weight, maintaining weight loss, or reaching her healthy weight goal.  Cathy Vargas's last visit with [place] was almost 3 years ago at the start of the COVID-19 pandemic.  Cathy Vargas is currently in the action stage of change and ready to dedicate time achieving and maintaining a healthier weight. Cathy Vargas is interested in becoming our patient and working on intensive lifestyle modifications including (but not limited to) diet and exercise for weight loss.  Cathy Vargas's habits were reviewed today and are as follows: Her family eats meals together, she thinks her family will eat healthier with her, her desired weight loss is 89 lbs, she has been heavy most of her life, she started gaining weight after giving birth to her son, her heaviest weight ever was 255 pounds, she has significant food cravings issues, she skips meals frequently, she frequently makes poor food choices, she has problems with excessive hunger, she frequently eats larger portions than normal, and she struggles with emotional eating.  Depression Screen Cathy Vargas's Food and Mood (modified PHQ-9) score was 14.  Depression screen PHQ 2/9 05/20/2021  Decreased Interest 3  Down, Depressed, Hopeless 1  PHQ - 2 Score 4  Altered sleeping 2  Tired, decreased energy 3  Change in appetite 2  Feeling bad or failure about yourself  2  Trouble concentrating 1  Moving slowly or fidgety/restless 0  Suicidal thoughts 0  PHQ-9 Score 14  Difficult doing work/chores Very difficult   Subjective:   1. Other fatigue Cathy Vargas admits to daytime somnolence and admits to waking up still tired. Patent has a history of symptoms of daytime fatigue and morning fatigue. Cathy Vargas  generally gets 7 hours of sleep per night, and states that she has generally restful sleep. Snoring is present. Apneic episodes are not present. Epworth Sleepiness Score is 4.  1. SOB (shortness of breath) on exertion Cathy Vargas notes increasing shortness of breath with exercising and seems to be worsening over time with weight gain. She notes getting out of breath sooner with activity than she used to. This has not gotten worse recently. Cathy Vargas denies shortness of breath at rest or orthopnea.  3. Insulin resistance Cathy Vargas has a history of insulin resistance. She is ready to work on diet and exercise again.  4. Other hyperlipidemia Cathy Vargas has a history of hyperlipidemia. She is ready to work on diet and weight loss.  5. Acquired hypothyroidism Cathy Vargas is stable on levothyroxine, but she notes some fatigue.   6. Vitamin D deficiency Cathy Vargas is on Vit D, and she has no recent labs. She notes fatigue.  Assessment/Plan:   1. Other fatigue Cathy Vargas does feel that her weight is causing her energy to be lower than it should be. Fatigue may be related to obesity, depression or many other causes. Labs will be ordered, and in the meanwhile, Cathy Vargas will focus on self care including making healthy food choices, increasing physical activity and focusing on stress reduction.  - CBC with Differential/Platelet - Folate - T3, free - T4, free - TSH - Vitamin B12  2. SOB (shortness of breath) on exertion Cathy Vargas does feel that she gets out of breath more easily that she used to when she exercises. Cathy Vargas's shortness  of breath appears to be obesity related and exercise induced. She has agreed to work on weight loss and gradually increase exercise to treat her exercise induced shortness of breath. Will continue to monitor closely.  3. Insulin resistance We will check labs today. Cathy Vargas will start her Category 3 plan, and will work on weight loss, exercise, and decreasing simple carbohydrates to help decrease the risk of diabetes. Cathy Vargas  agreed to follow-up with [place] as directed to closely monitor her progress.  - Comprehensive metabolic panel - Hemoglobin A1c - Insulin, random  4. Other hyperlipidemia Cardiovascular risk and specific lipid/LDL goals reviewed. We discussed several lifestyle modifications today. We will check labs today. Cathy Vargas will start her Category 3 plan, and will continue to work on diet, exercise and weight loss efforts. Orders and follow up as documented in patient record.   - Lipid panel  5. Acquired hypothyroidism We will check labs today, and will follow up at [facility] next visit. Orders and follow up as documented in patient record.  6. Vitamin D deficiency Low Vitamin D level contributes to fatigue and are associated with obesity, breast, and colon cancer. We will check labs today. Cathy Vargas will follow-up for routine testing of Vitamin D, at least 2-3 times per year to avoid over-replacement.  - VITAMIN D 25 Hydroxy (Vit-D Deficiency, Fractures)  7. Obesity with current BMI of 37.9 (BMI 30-39.9) Cathy Vargas is currently in the action stage of change and her goal is to continue with weight loss efforts. I recommend Cathy Vargas begin the structured treatment plan as follows:  She has agreed to the Category 3 Plan.  Exercise goals: No exercise has been prescribed at this time.   Behavioral modification strategies: increasing lean protein intake and decreasing simple carbohydrates.  She was informed of the importance of frequent follow-up visits to maximize her success with intensive lifestyle modifications for her multiple health conditions. She was informed we would discuss her lab results at her next visit unless there is a critical issue that needs to be addressed sooner. Cathy Vargas agreed to keep her next visit at the agreed upon time to discuss these results.  Objective:   Blood pressure 116/82, pulse 90, temperature 98.2 F (36.8 C), temperature source Oral, height 5\' 8"  (1.727 m), weight 249 lb (112.9 kg), last  menstrual period 04/27/2021, SpO2 97 %. Body mass index is 37.86 kg/m.  EKG: Normal sinus rhythm, rate (unable to obtain).  Indirect Calorimeter completed today shows a VO2 of 333 and a REE of 2304.  Her calculated basal metabolic rate is 04/29/2021 thus her basal metabolic rate is better than expected.  General: Cooperative, alert, well developed, in no acute distress. HEENT: Conjunctivae and lids unremarkable. Cardiovascular: Regular rhythm.  Lungs: Normal work of breathing. Neurologic: No focal deficits.   Lab Results  Component Value Date   CREATININE 0.71 05/20/2021   BUN 10 05/20/2021   NA 138 05/20/2021   K 4.0 05/20/2021   CL 103 05/20/2021   CO2 22 05/20/2021   Lab Results  Component Value Date   ALT 12 05/20/2021   AST 11 05/20/2021   ALKPHOS 52 05/20/2021   BILITOT 0.2 05/20/2021   Lab Results  Component Value Date   HGBA1C 5.3 05/20/2021   HGBA1C 5.4 05/29/2018   HGBA1C 5.5 01/18/2018   Lab Results  Component Value Date   INSULIN 27.7 (H) 05/20/2021   INSULIN 46.3 (H) 05/29/2018   INSULIN 22.0 01/18/2018   Lab Results  Component Value Date   TSH  2.740 05/20/2021   Lab Results  Component Value Date   CHOL 202 (H) 05/20/2021   HDL 47 05/20/2021   LDLCALC 123 (H) 05/20/2021   TRIG 183 (H) 05/20/2021   CHOLHDL 4.3 05/20/2021   Lab Results  Component Value Date   WBC 5.1 05/20/2021   HGB 13.9 05/20/2021   HCT 40.3 05/20/2021   MCV 87 05/20/2021   PLT 209 05/20/2021   No results found for: IRON, TIBC, FERRITIN  Attestation Statements:   Reviewed by clinician on day of visit: allergies, medications, problem list, medical history, surgical history, family history, social history, and previous encounter notes.  Time spent on visit including pre-visit chart review and post-visit charting and care was 58 minutes.    I, Burt KnackSharon martin, am acting as Energy managertranscriptionist for Quillian Quincearen Ellenor Wisniewski, MD.  I have reviewed the above documentation for accuracy and  completeness, and I agree with the above. - Quillian Quincearen Alija Riano, MD

## 2021-06-03 ENCOUNTER — Ambulatory Visit (INDEPENDENT_AMBULATORY_CARE_PROVIDER_SITE_OTHER): Payer: 59 | Admitting: Family Medicine

## 2021-06-03 ENCOUNTER — Other Ambulatory Visit: Payer: Self-pay

## 2021-06-03 ENCOUNTER — Encounter (INDEPENDENT_AMBULATORY_CARE_PROVIDER_SITE_OTHER): Payer: Self-pay | Admitting: Family Medicine

## 2021-06-03 VITALS — BP 142/85 | HR 90 | Temp 98.8°F | Ht 68.0 in | Wt 241.0 lb

## 2021-06-03 DIAGNOSIS — E559 Vitamin D deficiency, unspecified: Secondary | ICD-10-CM

## 2021-06-03 DIAGNOSIS — E8881 Metabolic syndrome: Secondary | ICD-10-CM | POA: Diagnosis not present

## 2021-06-03 DIAGNOSIS — Z6836 Body mass index (BMI) 36.0-36.9, adult: Secondary | ICD-10-CM

## 2021-06-03 DIAGNOSIS — E782 Mixed hyperlipidemia: Secondary | ICD-10-CM | POA: Diagnosis not present

## 2021-06-03 DIAGNOSIS — E669 Obesity, unspecified: Secondary | ICD-10-CM

## 2021-06-03 DIAGNOSIS — Z9189 Other specified personal risk factors, not elsewhere classified: Secondary | ICD-10-CM

## 2021-06-03 MED ORDER — VITAMIN D (ERGOCALCIFEROL) 1.25 MG (50000 UNIT) PO CAPS: 50000.0000 [IU] | ORAL_CAPSULE | ORAL | 0 refills | Status: DC

## 2021-06-03 MED ORDER — METFORMIN HCL 500 MG PO TABS: 500.0000 mg | ORAL_TABLET | Freq: Every day | ORAL | 0 refills | Status: DC

## 2021-06-03 NOTE — Progress Notes (Signed)
Chief Complaint:   OBESITY Cathy Vargas is here to discuss her progress with her obesity treatment plan along with follow-up of her obesity related diagnoses. Cathy Vargas is on the Category 3 Plan and states she is following her eating plan approximately 60-65% of the time. Cathy Vargas states she is doing 0 minutes 0 times per week.  Today's visit was #: 2 Starting weight: 249 lbs Starting date: 05/20/2021 Today's weight: 241 lbs Today's date: 06/03/2021 Total lbs lost to date: 8 Total lbs lost since last in-office visit: 8  Interim History: Cathy Vargas has done well with weight loss on her plan. She is getting ready to travel for the next week, and she is concerned about how she will stay on track.  Subjective:   1. Mixed hyperlipidemia Cathy Vargas's triglycerides and LDL are elevated. She is not on statin, and she is working on diet and weight loss. I discussed labs with the patient today.  2. Vitamin D deficiency Cathy Vargas is on OTC Vit D, but her level is not yet at goal. I discussed labs with the patient today.  3. Insulin resistance Cathy Vargas's A1c is at goal, but her fasting insulin is still elevated. She is open to starting medications in addition to diet and exercise. I discussed labs with the patient today.  4. At risk for diabetes mellitus Cathy Vargas is at higher than average risk for developing diabetes due to obesity.   Assessment/Plan:   1. Mixed hyperlipidemia Cardiovascular risk and specific lipid/LDL goals reviewed. We discussed several lifestyle modifications today. Cathy Vargas will continue to work on diet, exercise and weight loss efforts. Orders and follow up as documented in patient record.   2. Vitamin D deficiency Low Vitamin D level contributes to fatigue and are associated with obesity, breast, and colon cancer. Cathy Vargas agreed to start prescription Vitamin D 50,000 IU every week with no refills. She will follow-up for routine testing of Vitamin D, at least 2-3 times per year to avoid over-replacement.  -  Vitamin D, Ergocalciferol, (DRISDOL) 1.25 MG (50000 UNIT) CAPS capsule; Take 1 capsule (50,000 Units total) by mouth every 7 (seven) days.  Dispense: 4 capsule; Refill: 0  3. Insulin resistance Cathy Vargas agreed to start metformin 500 mg q AM with no refills. She will continue to work on weight loss, exercise, and decreasing simple carbohydrates to help decrease the risk of diabetes. Cathy Vargas agreed to follow-up with [place] as directed to closely monitor her progress.  - metFORMIN (GLUCOPHAGE) 500 MG tablet; Take 1 tablet (500 mg total) by mouth daily with breakfast.  Dispense: 30 tablet; Refill: 0  4. At risk for diabetes mellitus Cathy Vargas was given approximately 30 minutes of diabetes education and counseling today. We discussed intensive lifestyle modifications today with an emphasis on weight loss as well as increasing exercise and decreasing simple carbohydrates in her diet. We also reviewed medication options with an emphasis on risk versus benefit of those discussed.   Repetitive spaced learning was employed today to elicit superior memory formation and behavioral change.  5. Obesity with current BMI of 36.8 Cathy Vargas is currently in the action stage of change. As such, her goal is to continue with weight loss efforts. She has agreed to the Category 3 Plan.   Behavioral modification strategies: dealing with family or coworker sabotage, travel eating strategies, and holiday eating strategies .  Cathy Vargas has agreed to follow-up with our clinic in 2 weeks. She was informed of the importance of frequent follow-up visits to maximize her success with intensive lifestyle  modifications for her multiple health conditions.   Objective:   Blood pressure (!) 142/85, pulse 90, temperature 98.8 F (37.1 C), height 5\' 8"  (1.727 m), weight 241 lb (109.3 kg), SpO2 99 %. Body mass index is 36.64 kg/m.  General: Cooperative, alert, well developed, in no acute distress. HEENT: Conjunctivae and lids unremarkable. Cardiovascular:  Regular rhythm.  Lungs: Normal work of breathing. Neurologic: No focal deficits.   Lab Results  Component Value Date   CREATININE 0.71 05/20/2021   BUN 10 05/20/2021   NA 138 05/20/2021   K 4.0 05/20/2021   CL 103 05/20/2021   CO2 22 05/20/2021   Lab Results  Component Value Date   ALT 12 05/20/2021   AST 11 05/20/2021   ALKPHOS 52 05/20/2021   BILITOT 0.2 05/20/2021   Lab Results  Component Value Date   HGBA1C 5.3 05/20/2021   HGBA1C 5.4 05/29/2018   HGBA1C 5.5 01/18/2018   Lab Results  Component Value Date   INSULIN 27.7 (H) 05/20/2021   INSULIN 46.3 (H) 05/29/2018   INSULIN 22.0 01/18/2018   Lab Results  Component Value Date   TSH 2.740 05/20/2021   Lab Results  Component Value Date   CHOL 202 (H) 05/20/2021   HDL 47 05/20/2021   LDLCALC 123 (H) 05/20/2021   TRIG 183 (H) 05/20/2021   CHOLHDL 4.3 05/20/2021   Lab Results  Component Value Date   VD25OH 38.8 05/20/2021   VD25OH 50.8 05/29/2018   VD25OH 56.8 01/18/2018   Lab Results  Component Value Date   WBC 5.1 05/20/2021   HGB 13.9 05/20/2021   HCT 40.3 05/20/2021   MCV 87 05/20/2021   PLT 209 05/20/2021   No results found for: IRON, TIBC, FERRITIN  Attestation Statements:   Reviewed by clinician on day of visit: allergies, medications, problem list, medical history, surgical history, family history, social history, and previous encounter notes.   I, 07/18/2021, am acting as transcriptionist for Burt Knack, MD.  I have reviewed the above documentation for accuracy and completeness, and I agree with the above. -  Quillian Quince, MD

## 2021-06-17 ENCOUNTER — Other Ambulatory Visit: Payer: Self-pay

## 2021-06-17 ENCOUNTER — Encounter: Payer: Self-pay | Admitting: Family

## 2021-06-17 ENCOUNTER — Other Ambulatory Visit (HOSPITAL_COMMUNITY): Payer: Self-pay

## 2021-06-17 ENCOUNTER — Other Ambulatory Visit: Payer: Self-pay | Admitting: Family

## 2021-06-17 MED ORDER — LEVOTHYROXINE SODIUM 150 MCG PO TABS: 150.0000 ug | ORAL_TABLET | Freq: Every day | ORAL | 1 refills | Status: DC

## 2021-06-17 MED ORDER — LEVOTHYROXINE SODIUM 150 MCG PO TABS
150.0000 ug | ORAL_TABLET | Freq: Every day | ORAL | 1 refills | Status: DC
Start: 2021-06-17 — End: 2021-06-17
  Filled 2021-06-17: qty 90, 90d supply, fill #0

## 2021-06-17 NOTE — Telephone Encounter (Signed)
yes, ok to refill- 90 days with 1 refill - thx

## 2021-06-18 ENCOUNTER — Telehealth (INDEPENDENT_AMBULATORY_CARE_PROVIDER_SITE_OTHER): Payer: 59 | Admitting: Family Medicine

## 2021-06-18 ENCOUNTER — Encounter (INDEPENDENT_AMBULATORY_CARE_PROVIDER_SITE_OTHER): Payer: Self-pay | Admitting: Family Medicine

## 2021-06-18 DIAGNOSIS — J069 Acute upper respiratory infection, unspecified: Secondary | ICD-10-CM

## 2021-06-18 DIAGNOSIS — E669 Obesity, unspecified: Secondary | ICD-10-CM

## 2021-06-18 DIAGNOSIS — E559 Vitamin D deficiency, unspecified: Secondary | ICD-10-CM | POA: Diagnosis not present

## 2021-06-18 DIAGNOSIS — Z6836 Body mass index (BMI) 36.0-36.9, adult: Secondary | ICD-10-CM

## 2021-06-18 DIAGNOSIS — E8881 Metabolic syndrome: Secondary | ICD-10-CM | POA: Diagnosis not present

## 2021-06-18 MED ORDER — VITAMIN D (ERGOCALCIFEROL) 1.25 MG (50000 UNIT) PO CAPS: 50000.0000 [IU] | ORAL_CAPSULE | ORAL | 0 refills | Status: DC

## 2021-06-18 NOTE — Progress Notes (Signed)
TeleHealth Visit:  Due to the COVID-19 pandemic, this visit was completed with telemedicine (audio/video) technology to reduce patient and provider exposure as well as to preserve personal protective equipment.   Alessandria has verbally consented to this TeleHealth visit. The patient is located at home, the provider is located at the [facility] office. The participants in this visit include the listed provider and patient. The visit was conducted today via MyChart video.   Chief Complaint: OBESITY Rosary is here to discuss her progress with her obesity treatment plan along with follow-up of her obesity related diagnoses. Almina is on the Category 3 Plan and states she is following her eating plan approximately 50% of the time. Sheyann states she is doing 0 minutes 0 times per week.  Today's visit was #: 3 Starting weight: 249 lbs Starting date: 05/20/2021  Interim History: Yolander has been sick and her appetite has decreased. She hasn't been able to follow her plan, but she hasn't been over-eating either.  Subjective:   1. Upper respiratory tract infection, unspecified type Shaunta has a cough, congestion, pharyngitis, headache, and loss of taste and smell for 6 days. She notes family members are sick as well. Her appetite has decreased, but she is able to tolerate liquids and some foods.   2. Vitamin D deficiency Adelei is stable on Vit D, with no side effects noted.  3. Insulin resistance Machaela was prescribed metformin, but she hasn't started it yet due to being sick which is fine.  Assessment/Plan:   1. Upper respiratory tract infection, unspecified type Nikkole was encouraged to continue to hydrate and will get back to her eating plan when she feels better.  2. Vitamin D deficiency Low Vitamin D level contributes to fatigue and are associated with obesity, breast, and colon cancer. We will refill prescription Vitamin D for 1 month. Kenetha will follow-up for routine testing of Vitamin  D, at least 2-3 times per year to avoid over-replacement.  - Vitamin D, Ergocalciferol, (DRISDOL) 1.25 MG (50000 UNIT) CAPS capsule; Take 1 capsule (50,000 Units total) by mouth every 7 (seven) days.  Dispense: 4 capsule; Refill: 0  3. Insulin resistance Asuna agreed to start metformin when she is feeling better, and her appetite is back to normal. She will continue to work on weight loss, exercise, and decreasing simple carbohydrates to help decrease the risk of diabetes. Tanashia agreed to follow-up with [place] as directed to closely monitor her progress.  4. Obesity with current BMI of 36.8 Karrina is currently in the action stage of change. As such, her goal is to continue with weight loss efforts. She has agreed to the Category 3 Plan.   Behavioral modification strategies: increasing lean protein intake.  Andersyn has agreed to follow-up with our clinic in 3 weeks. She was informed of the importance of frequent follow-up visits to maximize her success with intensive lifestyle modifications for her multiple health conditions.  Objective:   VITALS: Per patient if applicable, see vitals. GENERAL: Alert and in no acute distress. CARDIOPULMONARY: No increased WOB. Speaking in clear sentences.  PSYCH: Pleasant and cooperative. Speech normal rate and rhythm. Affect is appropriate. Insight and judgement are appropriate. Attention is focused, linear, and appropriate.  NEURO: Oriented as arrived to appointment on time with no prompting.   Lab Results  Component Value Date   CREATININE 0.71 05/20/2021   BUN 10 05/20/2021   NA 138 05/20/2021   K 4.0 05/20/2021   CL 103 05/20/2021  CO2 22 05/20/2021   Lab Results  Component Value Date   ALT 12 05/20/2021   AST 11 05/20/2021   ALKPHOS 52 05/20/2021   BILITOT 0.2 05/20/2021   Lab Results  Component Value Date   HGBA1C 5.3 05/20/2021   HGBA1C 5.4 05/29/2018   HGBA1C 5.5 01/18/2018   Lab Results  Component Value Date   INSULIN 27.7 (H) 05/20/2021    INSULIN 46.3 (H) 05/29/2018   INSULIN 22.0 01/18/2018   Lab Results  Component Value Date   TSH 2.740 05/20/2021   Lab Results  Component Value Date   CHOL 202 (H) 05/20/2021   HDL 47 05/20/2021   LDLCALC 123 (H) 05/20/2021   TRIG 183 (H) 05/20/2021   CHOLHDL 4.3 05/20/2021   Lab Results  Component Value Date   VD25OH 38.8 05/20/2021   VD25OH 50.8 05/29/2018   VD25OH 56.8 01/18/2018   Lab Results  Component Value Date   WBC 5.1 05/20/2021   HGB 13.9 05/20/2021   HCT 40.3 05/20/2021   MCV 87 05/20/2021   PLT 209 05/20/2021   No results found for: IRON, TIBC, FERRITIN  Attestation Statements:   Reviewed by clinician on day of visit: allergies, medications, problem list, medical history, surgical history, family history, social history, and previous encounter notes.   I, Burt Knack, am acting as transcriptionist for Quillian Quince, MD.  I have reviewed the above documentation for accuracy and completeness, and I agree with the above. - Quillian Quince, MD

## 2021-07-08 ENCOUNTER — Ambulatory Visit (INDEPENDENT_AMBULATORY_CARE_PROVIDER_SITE_OTHER): Payer: 59 | Admitting: Family Medicine

## 2021-07-08 ENCOUNTER — Encounter (INDEPENDENT_AMBULATORY_CARE_PROVIDER_SITE_OTHER): Payer: Self-pay | Admitting: Family Medicine

## 2021-07-08 ENCOUNTER — Other Ambulatory Visit: Payer: Self-pay

## 2021-07-08 VITALS — BP 134/85 | HR 84 | Temp 98.6°F | Ht 68.0 in | Wt 237.0 lb

## 2021-07-08 DIAGNOSIS — E669 Obesity, unspecified: Secondary | ICD-10-CM | POA: Diagnosis not present

## 2021-07-08 DIAGNOSIS — E8881 Metabolic syndrome: Secondary | ICD-10-CM | POA: Diagnosis not present

## 2021-07-08 DIAGNOSIS — E559 Vitamin D deficiency, unspecified: Secondary | ICD-10-CM | POA: Diagnosis not present

## 2021-07-08 DIAGNOSIS — Z9189 Other specified personal risk factors, not elsewhere classified: Secondary | ICD-10-CM

## 2021-07-08 DIAGNOSIS — Z6836 Body mass index (BMI) 36.0-36.9, adult: Secondary | ICD-10-CM | POA: Diagnosis not present

## 2021-07-08 MED ORDER — METFORMIN HCL 500 MG PO TABS: 500.0000 mg | ORAL_TABLET | Freq: Every day | ORAL | 0 refills | Status: DC

## 2021-07-09 NOTE — Progress Notes (Signed)
? ? ? ?Chief Complaint:  ? ?OBESITY ?Cathy Vargas is here to discuss her progress with her obesity treatment plan along with follow-up of her obesity related diagnoses. Cathy Vargas is on the Category 3 Plan and states she is following her eating plan approximately 80% of the time. Cathy Vargas states she is doing 0 minutes 0 times per week. ? ?Today's visit was #: 4 ?Starting weight: 249 lbs ?Starting date: 05/20/2021 ?Today's weight: 237 lbs ?Today's date: 07/08/2021 ?Total lbs lost to date: 12 ?Total lbs lost since last in-office visit: 4 ? ?Interim History: Cathy Vargas has done well with weight loss. She feels she needs to work on portion control especially with eating out. Her sugar crvings have improved and she is feeling well since starting metformin. She got off track due to traveling, but she feels that she has gotten back on track over the past week. She is trying to drink more water. ? ?Subjective:  ? ?1. Vitamin D deficiency ?Cathy Vargas is taking Vitamin D 50,000 IU weekly. She denies side effects of nausea, vomiting, or muscle weakness. ? ?2. Insulin resistance ?Cathy Vargas is taking metformin 500 mg daily. She reports occasional GI upset. She feels it is helpful and would like to continue. ? ?3. At risk for heart disease ?Cathy Vargas is at higher than average risk for cardiovascular disease due to obesity. ? ?Assessment/Plan:  ? ?1. Vitamin D deficiency ?Cathy Vargas will continue prescription Vitamin D 50,000 IU every week and will follow-up for routine testing of Vitamin D, at least 2-3 times per year to avoid over-replacement. ? ?2. Insulin resistance ?Cathy Vargas will continue to work on weight loss, exercise, and decreasing simple carbohydrates to help decrease the risk of diabetes. We will refill metformin for 1 month. Side effects were discussed. Cathy Vargas agreed to follow-up with [place] as directed to closely monitor her progress. ? ?- metFORMIN (GLUCOPHAGE) 500 MG tablet; Take 1 tablet (500 mg total) by mouth daily with breakfast.  Dispense: 30 tablet; Refill: 0 ? ?3.  At risk for heart disease ?Cathy Vargas was given approximately 15 minutes of coronary artery disease prevention counseling today. She is 36 y.o. female and has risk factors for heart disease including obesity. We discussed intensive lifestyle modifications today with an emphasis on specific weight loss instructions and strategies. ? ?Repetitive spaced learning was employed today to elicit superior memory formation and behavioral change.  ? ?4. Obesity with current BMI of 36.1 ?Cathy Vargas is currently in the action stage of change. As such, her goal is to continue with weight loss efforts. She has agreed to the Category 3 Plan.  ? ?Exercise goals: Add in walking 2 days per week for 30 minutes. ? ?Behavioral modification strategies: increasing lean protein intake, increasing water intake, and meal planning and cooking strategies. ? ?Cathy Vargas has agreed to follow-up with our clinic in 2 weeks. She was informed of the importance of frequent follow-up visits to maximize her success with intensive lifestyle modifications for her multiple health conditions.  ? ?Objective:  ? ?Blood pressure 134/85, pulse 84, temperature 98.6 ?F (37 ?C), height 5\' 8"  (1.727 m), weight 237 lb (107.5 kg), SpO2 98 %. ?Body mass index is 36.04 kg/m?. ? ?General: Cooperative, alert, well developed, in no acute distress. ?HEENT: Conjunctivae and lids unremarkable. ?Cardiovascular: Regular rhythm.  ?Lungs: Normal work of breathing. ?Neurologic: No focal deficits.  ? ?Lab Results  ?Component Value Date  ? CREATININE 0.71 05/20/2021  ? BUN 10 05/20/2021  ? NA 138 05/20/2021  ? K 4.0 05/20/2021  ? CL 103  05/20/2021  ? CO2 22 05/20/2021  ? ?Lab Results  ?Component Value Date  ? ALT 12 05/20/2021  ? AST 11 05/20/2021  ? ALKPHOS 52 05/20/2021  ? BILITOT 0.2 05/20/2021  ? ?Lab Results  ?Component Value Date  ? HGBA1C 5.3 05/20/2021  ? HGBA1C 5.4 05/29/2018  ? HGBA1C 5.5 01/18/2018  ? ?Lab Results  ?Component Value Date  ? INSULIN 27.7 (H) 05/20/2021  ? INSULIN 46.3 (H)  05/29/2018  ? INSULIN 22.0 01/18/2018  ? ?Lab Results  ?Component Value Date  ? TSH 2.740 05/20/2021  ? ?Lab Results  ?Component Value Date  ? CHOL 202 (H) 05/20/2021  ? HDL 47 05/20/2021  ? LDLCALC 123 (H) 05/20/2021  ? TRIG 183 (H) 05/20/2021  ? CHOLHDL 4.3 05/20/2021  ? ?Lab Results  ?Component Value Date  ? VD25OH 38.8 05/20/2021  ? VD25OH 50.8 05/29/2018  ? VD25OH 56.8 01/18/2018  ? ?Lab Results  ?Component Value Date  ? WBC 5.1 05/20/2021  ? HGB 13.9 05/20/2021  ? HCT 40.3 05/20/2021  ? MCV 87 05/20/2021  ? PLT 209 05/20/2021  ? ?No results found for: IRON, TIBC, FERRITIN ? ?Attestation Statements:  ? ?Reviewed by clinician on day of visit: allergies, medications, problem list, medical history, surgical history, family history, social history, and previous encounter notes. ? ? ?I, Trixie Dredge, am acting as transcriptionist for Dennard Nip, MD. ? ?I have reviewed the above documentation for accuracy and completeness, and I agree with the above. -  Dennard Nip, MD ? ? ?

## 2021-07-28 ENCOUNTER — Encounter (INDEPENDENT_AMBULATORY_CARE_PROVIDER_SITE_OTHER): Payer: Self-pay | Admitting: Family Medicine

## 2021-07-28 ENCOUNTER — Ambulatory Visit (INDEPENDENT_AMBULATORY_CARE_PROVIDER_SITE_OTHER): Payer: 59 | Admitting: Family Medicine

## 2021-07-28 ENCOUNTER — Other Ambulatory Visit: Payer: Self-pay

## 2021-07-28 VITALS — BP 137/87 | HR 83 | Temp 98.8°F | Ht 68.0 in | Wt 234.0 lb

## 2021-07-28 DIAGNOSIS — Z6835 Body mass index (BMI) 35.0-35.9, adult: Secondary | ICD-10-CM

## 2021-07-28 DIAGNOSIS — E559 Vitamin D deficiency, unspecified: Secondary | ICD-10-CM

## 2021-07-28 DIAGNOSIS — Z9189 Other specified personal risk factors, not elsewhere classified: Secondary | ICD-10-CM

## 2021-07-28 DIAGNOSIS — E669 Obesity, unspecified: Secondary | ICD-10-CM

## 2021-07-28 MED ORDER — VITAMIN D (ERGOCALCIFEROL) 1.25 MG (50000 UNIT) PO CAPS: 50000.0000 [IU] | ORAL_CAPSULE | ORAL | 0 refills | Status: DC

## 2021-07-30 NOTE — Progress Notes (Signed)
? ? ? ?Chief Complaint:  ? ?OBESITY ?Cathy Vargas is here to discuss her progress with her obesity treatment plan along with follow-up of her obesity related diagnoses. [name] is on the Category 3 Plan and states she is following her eating plan approximately 60% of the time. Cathy Vargas states she is walking for 30 minutes 2 times per week. ? ?Today's visit was #: 5 ?Starting weight: 249 lbs ?Starting date: 05/20/2021 ?Today's weight: 234 lbs ?Today's date: 07/28/2021 ?Total lbs lost to date: 60 ?Total lbs lost since last in-office visit: 3 ? ?Interim History: Cathy Vargas continues to do well with weight loss. She notes some all or nothing eating behaviors, and she struggles to get back on track at times. ? ?Subjective:  ? ?1. Vitamin D deficiency ?Cathy Vargas is on Vitamin D, but her level is not yet at goal. ? ?2. At risk for impaired metabolic function ?[name] is at increased risk for impaired metabolic function if protein decreases.  ? ?Assessment/Plan:  ? ?1. Vitamin D deficiency ?We will refill prescription Vitamin D for 1 month. [name] will follow-up for routine testing of Vitamin D, at least 2-3 times per year to avoid over-replacement. ? ?- Vitamin D, Ergocalciferol, (DRISDOL) 1.25 MG (50000 UNIT) CAPS capsule; Take 1 capsule (50,000 Units total) by mouth every 7 (seven) days.  Dispense: 4 capsule; Refill: 0 ? ?2. At risk for impaired metabolic function ?Cathy Vargas was given approximately 15 minutes of impaired  metabolic function prevention counseling today. We discussed intensive lifestyle modifications today with an emphasis on specific nutrition and exercise instructions and strategies.  ? ?Repetitive spaced learning was employed today to elicit superior memory formation and behavioral change. ? ?3. Obesity with current BMI of 35.6 ?Cathy Vargas is currently in the action stage of change. As such, her goal is to continue with weight loss efforts. She has agreed to the Category 3 Plan with breakfast drink options.  ? ?Exercise goals: As  is. ? ?Behavioral modification strategies: increasing lean protein intake and avoiding temptations. ? ?Cathy Vargas has agreed to follow-up with our clinic in 2 weeks. She was informed of the importance of frequent follow-up visits to maximize her success with intensive lifestyle modifications for her multiple health conditions.  ? ?Objective:  ? ?Blood pressure 137/87, pulse 83, temperature 98.8 ?F (37.1 ?C), height 5\' 8"  (1.727 m), weight 234 lb (106.1 kg), SpO2 97 %. ?Body mass index is 35.58 kg/m?. ? ?General: Cooperative, alert, well developed, in no acute distress. ?HEENT: Conjunctivae and lids unremarkable. ?Cardiovascular: Regular rhythm.  ?Lungs: Normal work of breathing. ?Neurologic: No focal deficits.  ? ?Lab Results  ?Component Value Date  ? CREATININE 0.71 05/20/2021  ? BUN 10 05/20/2021  ? NA 138 05/20/2021  ? K 4.0 05/20/2021  ? CL 103 05/20/2021  ? CO2 22 05/20/2021  ? ?Lab Results  ?Component Value Date  ? ALT 12 05/20/2021  ? AST 11 05/20/2021  ? ALKPHOS 52 05/20/2021  ? BILITOT 0.2 05/20/2021  ? ?Lab Results  ?Component Value Date  ? HGBA1C 5.3 05/20/2021  ? HGBA1C 5.4 05/29/2018  ? HGBA1C 5.5 01/18/2018  ? ?Lab Results  ?Component Value Date  ? INSULIN 27.7 (H) 05/20/2021  ? INSULIN 46.3 (H) 05/29/2018  ? INSULIN 22.0 01/18/2018  ? ?Lab Results  ?Component Value Date  ? TSH 2.740 05/20/2021  ? ?Lab Results  ?Component Value Date  ? CHOL 202 (H) 05/20/2021  ? HDL 47 05/20/2021  ? LDLCALC 123 (H) 05/20/2021  ? TRIG 183 (H) 05/20/2021  ?  CHOLHDL 4.3 05/20/2021  ? ?Lab Results  ?Component Value Date  ? VD25OH 38.8 05/20/2021  ? VD25OH 50.8 05/29/2018  ? VD25OH 56.8 01/18/2018  ? ?Lab Results  ?Component Value Date  ? WBC 5.1 05/20/2021  ? HGB 13.9 05/20/2021  ? HCT 40.3 05/20/2021  ? MCV 87 05/20/2021  ? PLT 209 05/20/2021  ? ?No results found for: IRON, TIBC, FERRITIN ? ?Attestation Statements:  ? ?Reviewed by clinician on day of visit: allergies, medications, problem list, medical history, surgical  history, family history, social history, and previous encounter notes. ? ? ?I, Trixie Dredge, am acting as transcriptionist for Dennard Nip, MD. ? ?I have reviewed the above documentation for accuracy and completeness, and I agree with the above. -  Dennard Nip, MD ? ? ?

## 2021-08-07 ENCOUNTER — Encounter (INDEPENDENT_AMBULATORY_CARE_PROVIDER_SITE_OTHER): Payer: Self-pay | Admitting: Family Medicine

## 2021-08-12 ENCOUNTER — Ambulatory Visit (INDEPENDENT_AMBULATORY_CARE_PROVIDER_SITE_OTHER): Payer: 59 | Admitting: Family Medicine

## 2021-08-18 ENCOUNTER — Other Ambulatory Visit (INDEPENDENT_AMBULATORY_CARE_PROVIDER_SITE_OTHER): Payer: Self-pay | Admitting: Family Medicine

## 2021-08-18 DIAGNOSIS — E8881 Metabolic syndrome: Secondary | ICD-10-CM

## 2021-08-18 DIAGNOSIS — E559 Vitamin D deficiency, unspecified: Secondary | ICD-10-CM

## 2021-08-18 MED ORDER — METFORMIN HCL 500 MG PO TABS: 500.0000 mg | ORAL_TABLET | Freq: Every day | ORAL | 0 refills | Status: DC

## 2021-08-18 MED ORDER — VITAMIN D (ERGOCALCIFEROL) 1.25 MG (50000 UNIT) PO CAPS: 50000.0000 [IU] | ORAL_CAPSULE | ORAL | 0 refills | Status: DC

## 2021-08-18 NOTE — Telephone Encounter (Signed)
LAST APPOINTMENT DATE: 07/28/21 NEXT APPOINTMENT DATE: 08/31/21   [id number] - [place], [place] [address] Phone: [phone] Fax: [phone]  [facility] [place], [place] [address] [place] [address] Phone: [phone] Fax: [phone]  Patient is requesting a refill of the following medications: Requested Prescriptions   Pending Prescriptions Disp Refills   metFORMIN (GLUCOPHAGE) 500 MG tablet 30 tablet 0    Sig: Take 1 tablet (500 mg total) by mouth daily with breakfast.   Vitamin D, Ergocalciferol, (DRISDOL) 1.25 MG (50000 UNIT) CAPS capsule 4 capsule 0    Sig: Take 1 capsule (50,000 Units total) by mouth every 7 (seven) days.    Date last filled: 07/08/21 Previously prescribed by Dr. Leafy Ro  Lab Results  Component Value Date   HGBA1C 5.3 05/20/2021   HGBA1C 5.4 05/29/2018   HGBA1C 5.5 01/18/2018   Lab Results  Component Value Date   LDLCALC 123 (H) 05/20/2021   CREATININE 0.71 05/20/2021   Lab Results  Component Value Date   VD25OH 38.8 05/20/2021   VD25OH 50.8 05/29/2018   VD25OH 56.8 01/18/2018    BP Readings from Last 3 Encounters:  07/28/21 137/87  07/08/21 134/85  06/03/21 (!) 142/85

## 2021-08-31 ENCOUNTER — Ambulatory Visit (INDEPENDENT_AMBULATORY_CARE_PROVIDER_SITE_OTHER): Payer: 59 | Admitting: Family Medicine

## 2021-08-31 VITALS — BP 139/86 | HR 85 | Temp 98.6°F | Ht 68.0 in | Wt 236.0 lb

## 2021-08-31 DIAGNOSIS — E669 Obesity, unspecified: Secondary | ICD-10-CM

## 2021-08-31 DIAGNOSIS — Z6836 Body mass index (BMI) 36.0-36.9, adult: Secondary | ICD-10-CM

## 2021-08-31 DIAGNOSIS — E8881 Metabolic syndrome: Secondary | ICD-10-CM

## 2021-08-31 MED ORDER — WEGOVY 0.25 MG/0.5ML ~~LOC~~ SOAJ: 0.2500 mg | SUBCUTANEOUS | 0 refills | Status: DC

## 2021-09-01 ENCOUNTER — Encounter (INDEPENDENT_AMBULATORY_CARE_PROVIDER_SITE_OTHER): Payer: Self-pay

## 2021-09-01 ENCOUNTER — Telehealth (INDEPENDENT_AMBULATORY_CARE_PROVIDER_SITE_OTHER): Payer: Self-pay | Admitting: Family Medicine

## 2021-09-01 NOTE — Telephone Encounter (Signed)
Dr.Beasley 

## 2021-09-01 NOTE — Telephone Encounter (Signed)
Prior authorization approved for Cathy Vargas - [facility]. Effective: 08/02/21 - 03/30/22. Patient sent approval message via mychart.  ?

## 2021-09-10 NOTE — Progress Notes (Signed)
? ? ? ?Chief Complaint:  ? ?OBESITY ?Cathy Vargas is here to discuss her progress with her obesity treatment plan along with follow-up of her obesity related diagnoses. Cathy Vargas is on the Category 3 Plan and states she is following her eating plan approximately 0% of the time. Cathy Vargas states she is walking 1 time per week.  ? ?Today's visit was #: 6 ?Starting weight: 249 lbs ?Starting date: 05/20/2021 ?Today's weight: 236 lbs ?Today's date: 08/31/2021 ?Total lbs lost to date: 13 ?Total lbs lost since last in-office visit: 0 ? ?Interim History: Cathy Vargas continues to work on her diet and weight loss. She struggles with some hunger and not feeling satisfied. She is open to looking at other medication options. ? ?Subjective:  ? ?1. Insulin resistance ?Cathy Vargas has GI upset when she went off her meal plan.  ? ?Assessment/Plan:  ? ?1. Insulin resistance ?Cathy Vargas agreed to discontinue metformin, and start GLP-1 and we will follow up at her next visit. ? ?2. Obesity with current BMI of 36.0 ?Cathy Vargas is currently in the action stage of change. As such, her goal is to continue with weight loss efforts. She has agreed to the Category 3 Plan.  ? ?We will recheck fasting labs at her next visit. ? ?We discussed various medication options to help Cathy Vargas with her weight loss efforts and we both agreed to start Cathy Vargas 0.25 mg 2 week with no refills. ? ?- Semaglutide-Weight Management (Cathy Vargas) 0.25 MG/0.5ML SOAJ; Inject 0.25 mg into the skin once a week.  Dispense: 2 mL; Refill: 0 ? ?Exercise goals: As is. ? ?Behavioral modification strategies: increasing lean protein intake and no skipping meals. ? ?Cathy Vargas has agreed to follow-up with our clinic in 3 weeks. She was informed of the importance of frequent follow-up visits to maximize her success with intensive lifestyle modifications for her multiple health conditions.  ? ?Objective:  ? ?Blood pressure 139/86, pulse 85, temperature 98.6 ?F (37 ?C), temperature source Oral, height 5\' 8"  (1.727 m), weight 236 lb (107  kg), SpO2 98 %. ?Body mass index is 35.88 kg/m?. ? ?General: Cooperative, alert, well developed, in no acute distress. ?HEENT: Conjunctivae and lids unremarkable. ?Cardiovascular: Regular rhythm.  ?Lungs: Normal work of breathing. ?Neurologic: No focal deficits.  ? ?Lab Results  ?Component Value Date  ? CREATININE 0.71 05/20/2021  ? BUN 10 05/20/2021  ? NA 138 05/20/2021  ? K 4.0 05/20/2021  ? CL 103 05/20/2021  ? CO2 22 05/20/2021  ? ?Lab Results  ?Component Value Date  ? ALT 12 05/20/2021  ? AST 11 05/20/2021  ? ALKPHOS 52 05/20/2021  ? BILITOT 0.2 05/20/2021  ? ?Lab Results  ?Component Value Date  ? HGBA1C 5.3 05/20/2021  ? HGBA1C 5.4 05/29/2018  ? HGBA1C 5.5 01/18/2018  ? ?Lab Results  ?Component Value Date  ? INSULIN 27.7 (H) 05/20/2021  ? INSULIN 46.3 (H) 05/29/2018  ? INSULIN 22.0 01/18/2018  ? ?Lab Results  ?Component Value Date  ? TSH 2.740 05/20/2021  ? ?Lab Results  ?Component Value Date  ? CHOL 202 (H) 05/20/2021  ? HDL 47 05/20/2021  ? LDLCALC 123 (H) 05/20/2021  ? TRIG 183 (H) 05/20/2021  ? CHOLHDL 4.3 05/20/2021  ? ?Lab Results  ?Component Value Date  ? VD25OH 38.8 05/20/2021  ? VD25OH 50.8 05/29/2018  ? VD25OH 56.8 01/18/2018  ? ?Lab Results  ?Component Value Date  ? WBC 5.1 05/20/2021  ? HGB 13.9 05/20/2021  ? HCT 40.3 05/20/2021  ? MCV 87 05/20/2021  ? PLT 209  05/20/2021  ? ?No results found for: IRON, TIBC, FERRITIN ? ?Attestation Statements:  ? ?Reviewed by clinician on day of visit: allergies, medications, problem list, medical history, surgical history, family history, social history, and previous encounter notes. ? ?Time spent on visit including pre-visit chart review and post-visit care and charting was 32 minutes.  ? ? ?I, Burt Knack, am acting as transcriptionist for Quillian Quince, MD. ? ?I have reviewed the above documentation for accuracy and completeness, and I agree with the above. -  Quillian Quince, MD ? ? ?

## 2021-09-21 ENCOUNTER — Ambulatory Visit (INDEPENDENT_AMBULATORY_CARE_PROVIDER_SITE_OTHER): Payer: 59 | Admitting: Nurse Practitioner

## 2021-09-21 ENCOUNTER — Encounter (INDEPENDENT_AMBULATORY_CARE_PROVIDER_SITE_OTHER): Payer: Self-pay | Admitting: Nurse Practitioner

## 2021-09-21 VITALS — BP 120/86 | HR 78 | Temp 98.0°F | Ht 68.0 in | Wt 235.0 lb

## 2021-09-21 DIAGNOSIS — E782 Mixed hyperlipidemia: Secondary | ICD-10-CM

## 2021-09-21 DIAGNOSIS — E8881 Metabolic syndrome: Secondary | ICD-10-CM

## 2021-09-21 DIAGNOSIS — E669 Obesity, unspecified: Secondary | ICD-10-CM

## 2021-09-21 DIAGNOSIS — Z6835 Body mass index (BMI) 35.0-35.9, adult: Secondary | ICD-10-CM

## 2021-09-21 DIAGNOSIS — E559 Vitamin D deficiency, unspecified: Secondary | ICD-10-CM | POA: Diagnosis not present

## 2021-09-21 DIAGNOSIS — E538 Deficiency of other specified B group vitamins: Secondary | ICD-10-CM | POA: Diagnosis not present

## 2021-09-21 MED ORDER — WEGOVY 0.5 MG/0.5ML ~~LOC~~ SOAJ: 0.5000 mg | SUBCUTANEOUS | 0 refills | Status: DC

## 2021-09-21 MED ORDER — VITAMIN D (ERGOCALCIFEROL) 1.25 MG (50000 UNIT) PO CAPS: 50000.0000 [IU] | ORAL_CAPSULE | ORAL | 0 refills | Status: DC

## 2021-09-22 LAB — COMPREHENSIVE METABOLIC PANEL
ALT: 13 IU/L (ref 0–32)
AST: 9 IU/L (ref 0–40)
Albumin/Globulin Ratio: 1.6 (ref 1.2–2.2)
Albumin: 4.4 g/dL (ref 3.8–4.8)
Alkaline Phosphatase: 54 IU/L (ref 44–121)
BUN/Creatinine Ratio: 11 (ref 9–23)
BUN: 9 mg/dL (ref 6–20)
Bilirubin Total: 0.3 mg/dL (ref 0.0–1.2)
CO2: 22 mmol/L (ref 20–29)
Calcium: 9.1 mg/dL (ref 8.7–10.2)
Chloride: 105 mmol/L (ref 96–106)
Creatinine, Ser: 0.8 mg/dL (ref 0.57–1.00)
Globulin, Total: 2.7 g/dL (ref 1.5–4.5)
Glucose: 95 mg/dL (ref 70–99)
Potassium: 4.2 mmol/L (ref 3.5–5.2)
Sodium: 140 mmol/L (ref 134–144)
Total Protein: 7.1 g/dL (ref 6.0–8.5)
eGFR: 97 mL/min/{1.73_m2} (ref >59)

## 2021-09-22 LAB — VITAMIN B12: Vitamin B-12: 293 pg/mL (ref 232–1245)

## 2021-09-22 LAB — VITAMIN D 25 HYDROXY (VIT D DEFICIENCY, FRACTURES): Vit D, 25-Hydroxy: 69.1 ng/mL (ref 30.0–100.0)

## 2021-09-22 LAB — LIPID PANEL WITH LDL/HDL RATIO
Cholesterol, Total: 196 mg/dL (ref 100–199)
HDL: 46 mg/dL (ref >39)
LDL Chol Calc (NIH): 122 mg/dL — ABNORMAL HIGH (ref 0–99)
LDL/HDL Ratio: 2.7 ratio (ref 0.0–3.2)
Triglycerides: 158 mg/dL — ABNORMAL HIGH (ref 0–149)
VLDL Cholesterol Cal: 28 mg/dL (ref 5–40)

## 2021-09-22 LAB — INSULIN, RANDOM: INSULIN: 15.5 u[IU]/mL (ref 2.6–24.9)

## 2021-09-22 NOTE — Progress Notes (Signed)
? ? ? ?Chief Complaint:  ? ?OBESITY ?Cathy Vargas is here to discuss her progress with her obesity treatment plan along with follow-up of her obesity related diagnoses. Cathy Vargas is on the Category 3 Plan and states she is following her eating plan approximately 70% of the time. Cathy Vargas states she is doing 0 minutes 0 times per week. ? ?Today's visit was #: 7 ?Starting weight: 249 lbs ?Starting date: 05/20/2021 ?Today's weight: 235 lbs ?Today's date: 09/21/2021 ?Total lbs lost to date: 14 lbs ?Total lbs lost since last in-office visit: 1 lb ? ?Interim History: Cathy Vargas overall has done well with weight loss. She is taking Wegovy 0.25 mg. She denies side effects. She reports hunger and cravings but notes some improvement. She is focusing on eating her protein first. She is drinking coffee once per week, sparkling water and water.  ? ?Subjective:  ? ?1. Vitamin D deficiency ?Cathy Vargas is currently taking Vitamin D 50,000 IU weekly. She denies side effects nausea, vomiting, and muscle weakness.  ? ?2. Low vitamin B12 level ?Cathy Vargas has taken Vitamin B 12 over the counter in the past. She is not currently on Vitamin B 12. ? ?3. Insulin resistance ?Cathy Vargas has taken Metformin in the past.  She stopped due to gastrointestinal upset. She is taking Wegovy 0.25 mg. She denies side effects.  ? ?4. Mixed hyperlipidemia ?Cathy Vargas is not on a statin currently.  ? ?Assessment/Plan:  ? ?1. Vitamin D deficiency ?Low Vitamin D level contributes to fatigue and are associated with obesity, breast, and colon cancer. We will refill prescription Vitamin D 50,000 IU every week for 1 month with 1 refill and Cathy Vargas will follow-up for routine testing of Vitamin D, at least 2-3 times per year to avoid over-replacement. We discussed labs today. We obtained labs today.  ? ?- VITAMIN D 25 Hydroxy (Vit-D Deficiency, Fractures) ? ?- Vitamin D, Ergocalciferol, (DRISDOL) 1.25 MG (50000 UNIT) CAPS capsule; Take 1 capsule (50,000 Units total) by mouth every 7 (seven) days.  Dispense: 4  capsule; Refill: 0 ? ?2. Low vitamin B12 level ?The diagnosis was reviewed with the patient. Labs obtained today. Counseling provided today, see below. We will continue to monitor. Orders and follow up as documented in patient record. ? ?Counseling ?The body needs vitamin B12: to make red blood cells; to make DNA; and to help the nerves work properly so they can carry messages from the brain to the body.  ?The main causes of vitamin B12 deficiency include dietary deficiency, digestive diseases, pernicious anemia, and having a surgery in which part of the stomach or small intestine is removed.  ?Certain medicines can make it harder for the body to absorb vitamin B12. These medicines include: heartburn medications; some antibiotics; some medications used to treat diabetes, gout, and high cholesterol.  ?In some cases, there are no symptoms of this condition. If the condition leads to anemia or nerve damage, various symptoms can occur, such as weakness or fatigue, shortness of breath, and numbness or tingling in your hands and feet.   ?Treatment:  ?May include taking vitamin B12 supplements.  ?Avoid alcohol.  ?Eat lots of healthy foods that contain vitamin B12: ?Beef, pork, chicken, Cathy Vargas, and organ meats, such as liver.  ?Seafood: This includes clams, rainbow trout, salmon, tuna, and haddock. Eggs.  ?Cereal and dairy products that are fortified: This means that vitamin B12 has been added to the food.   ? ?- Vitamin B12 ? ?3. Insulin resistance ?Cathy Vargas will continue to work on weight loss, exercise,  and decreasing simple carbohydrates to help decrease the risk of diabetes. Cathy Vargas agreed to follow-up with [place] as directed to closely monitor her progress. Cathy Vargas will continue [employer]. She agrees to increase 0.5 mg. We discussed side effects. Labs obtained today.  ? ?- Insulin, random ? ?4. Mixed hyperlipidemia ?Cardiovascular risk and specific lipid/LDL goals reviewed.  Labs obtained today. We discussed several lifestyle  modifications today and Cathy Vargas will continue to work on diet, exercise and weight loss efforts. Orders and follow up as documented in patient record.  ? ?Counseling ?Intensive lifestyle modifications are the first line treatment for this issue. ?Dietary changes: Increase soluble fiber. Decrease simple carbohydrates. ?Exercise changes: Moderate to vigorous-intensity aerobic activity 150 minutes per week if tolerated. ?Lipid-lowering medications: see documented in medical record. ? ?- Comprehensive metabolic panel ?- Lipid Panel With LDL/HDL Ratio ? ?5. Obesity with current BMI of 35.8 ?Cathy Vargas is currently in the action stage of change. As such, her goal is to continue with weight loss efforts. She has agreed to the Category 3 Plan.  ? ?Cathy Vargas will continue [phone]. She agrees to increase Wegovy 0.5 mg. We discussed side effects.  ? ?- Semaglutide-Weight Management (WEGOVY) 0.5 MG/0.5ML SOAJ; Inject 0.5 mg into the skin once a week.  Dispense: 2 mL; Refill: 0 ? ?Exercise goals: No exercise has been prescribed at this time. ? ?Behavioral modification strategies: increasing lean protein intake, increasing water intake, and no skipping meals. ? ?Cathy Vargas has agreed to follow-up with our clinic in 3 weeks. She was informed of the importance of frequent follow-up visits to maximize her success with intensive lifestyle modifications for her multiple health conditions.  ? ?Objective:  ? ?Blood pressure 120/86, pulse 78, temperature 98 ?F (36.7 ?C), height 5\' 8"  (1.727 m), weight 235 lb (106.6 kg), SpO2 98 %. ?Body mass index is 35.73 kg/m?. ? ?General: Cooperative, alert, well developed, in no acute distress. ?HEENT: Conjunctivae and lids unremarkable. ?Cardiovascular: Regular rhythm.  ?Lungs: Normal work of breathing. ?Neurologic: No focal deficits.  ? ?Lab Results  ?Component Value Date  ? CREATININE 0.80 09/21/2021  ? BUN 9 09/21/2021  ? NA 140 09/21/2021  ? K 4.2 09/21/2021  ? CL 105 09/21/2021  ? CO2 22 09/21/2021  ? ?Lab Results   ?Component Value Date  ? ALT 13 09/21/2021  ? AST 9 09/21/2021  ? ALKPHOS 54 09/21/2021  ? BILITOT 0.3 09/21/2021  ? ?Lab Results  ?Component Value Date  ? HGBA1C 5.3 05/20/2021  ? HGBA1C 5.4 05/29/2018  ? HGBA1C 5.5 01/18/2018  ? ?Lab Results  ?Component Value Date  ? INSULIN 15.5 09/21/2021  ? INSULIN 27.7 (H) 05/20/2021  ? INSULIN 46.3 (H) 05/29/2018  ? INSULIN 22.0 01/18/2018  ? ?Lab Results  ?Component Value Date  ? TSH 2.740 05/20/2021  ? ?Lab Results  ?Component Value Date  ? CHOL 196 09/21/2021  ? HDL 46 09/21/2021  ? LDLCALC 122 (H) 09/21/2021  ? TRIG 158 (H) 09/21/2021  ? CHOLHDL 4.3 05/20/2021  ? ?Lab Results  ?Component Value Date  ? VD25OH 69.1 09/21/2021  ? VD25OH 38.8 05/20/2021  ? VD25OH 50.8 05/29/2018  ? ?Lab Results  ?Component Value Date  ? WBC 5.1 05/20/2021  ? HGB 13.9 05/20/2021  ? HCT 40.3 05/20/2021  ? MCV 87 05/20/2021  ? PLT 209 05/20/2021  ? ?No results found for: IRON, TIBC, FERRITIN ? ?Attestation Statements:  ? ?Reviewed by clinician on day of visit: allergies, medications, problem list, medical history, surgical history, family history, social history,  and previous encounter notes. ? ?I, Jackson Latino, RMA, am acting as Energy manager for Irene Limbo, FNP. ? ?I have reviewed the above documentation for accuracy and completeness, and I agree with the above. Irene Limbo, FNP  ?

## 2021-09-27 ENCOUNTER — Encounter (INDEPENDENT_AMBULATORY_CARE_PROVIDER_SITE_OTHER): Payer: Self-pay | Admitting: Nurse Practitioner

## 2021-09-27 DIAGNOSIS — E669 Obesity, unspecified: Secondary | ICD-10-CM

## 2021-09-28 ENCOUNTER — Other Ambulatory Visit (INDEPENDENT_AMBULATORY_CARE_PROVIDER_SITE_OTHER): Payer: Self-pay | Admitting: Nurse Practitioner

## 2021-09-28 MED ORDER — WEGOVY 0.25 MG/0.5ML ~~LOC~~ SOAJ: 0.2500 mg | SUBCUTANEOUS | 0 refills | Status: DC

## 2021-09-28 NOTE — Telephone Encounter (Signed)
Stephanie

## 2021-10-13 ENCOUNTER — Encounter (INDEPENDENT_AMBULATORY_CARE_PROVIDER_SITE_OTHER): Payer: Self-pay | Admitting: Nurse Practitioner

## 2021-10-13 ENCOUNTER — Ambulatory Visit (INDEPENDENT_AMBULATORY_CARE_PROVIDER_SITE_OTHER): Payer: 59 | Admitting: Nurse Practitioner

## 2021-10-13 VITALS — BP 138/94 | HR 81 | Temp 98.9°F | Ht 68.0 in | Wt 236.0 lb

## 2021-10-13 DIAGNOSIS — E669 Obesity, unspecified: Secondary | ICD-10-CM | POA: Diagnosis not present

## 2021-10-13 DIAGNOSIS — E782 Mixed hyperlipidemia: Secondary | ICD-10-CM

## 2021-10-13 DIAGNOSIS — E559 Vitamin D deficiency, unspecified: Secondary | ICD-10-CM

## 2021-10-13 DIAGNOSIS — E538 Deficiency of other specified B group vitamins: Secondary | ICD-10-CM

## 2021-10-13 DIAGNOSIS — Z6836 Body mass index (BMI) 36.0-36.9, adult: Secondary | ICD-10-CM

## 2021-10-13 MED ORDER — VITAMIN D (ERGOCALCIFEROL) 1.25 MG (50000 UNIT) PO CAPS: 50000.0000 [IU] | ORAL_CAPSULE | ORAL | 0 refills | Status: DC

## 2021-10-14 NOTE — Progress Notes (Signed)
Chief Complaint:   OBESITY Cathy Vargas is here to discuss her progress with her obesity treatment plan along with follow-up of her obesity related diagnoses. Cathy Vargas is on the Category 3 Plan and states she is following her eating plan approximately 50% of the time. Cathy Vargas states she is walking for 30 minutes 1 times per week.  Today's visit was #: 8 Starting weight: 249 lbs Starting date: 05/20/2021 Today's weight: 236 lbs Today's date: 10/13/2021 Total lbs lost to date: 13 lbs Total lbs lost since last in-office visit: 0  Interim History: Cathy Vargas is taking Wegovy 0.25 mg x the past 6 weeks.  She denies side effects. She wasn't able to increase dose due to being unable to find 0.5 mg because of a shortage at the pharmacy. She is struggling with meal planning. She is eating 2 meals per day. She is not always making the best choices and eating large dinner portions. She is craving sugar.   Subjective:   1. Vitamin D deficiency Cathy Vargas is currently taking prescription vitamin D 50,000 IU each week. She denies nausea, vomiting or muscle weakness. We discussed labs today.   2. Low vitamin B12 level Cathy Vargas is not taking Vitamin B 12 or multivitamin over the counter. We discussed labs today.   3. Mixed hyperlipidemia Cathy Vargas has never been on medications. We discussed labs today.   Assessment/Plan:   1. Vitamin D deficiency Low Vitamin D level contributes to fatigue and are associated with obesity, breast, and colon cancer. We will refill prescription Vitamin D 50,000 IU every week for 1 month with no refills and [name] will follow-up for routine testing of Vitamin D, at least 2-3 times per year to avoid over-replacement. We discussed side effects today.   - Vitamin D, Ergocalciferol, (DRISDOL) 1.25 MG (50000 UNIT) CAPS capsule; Take 1 capsule (50,000 Units total) by mouth every 7 (seven) days.  Dispense: 4 capsule; Refill: 0  2. Low vitamin B12 level The diagnosis was reviewed with the patient. [name] agrees  to start over the counter Vitamin B 12. We discussed side effects. Counseling provided today, see below. We will continue to monitor. Orders and follow up as documented in patient record.  Counseling The body needs vitamin B12: to make red blood cells; to make DNA; and to help the nerves work properly so they can carry messages from the brain to the body.  The main causes of vitamin B12 deficiency include dietary deficiency, digestive diseases, pernicious anemia, and having a surgery in which part of the stomach or small intestine is removed.  Certain medicines can make it harder for the body to absorb vitamin B12. These medicines include: heartburn medications; some antibiotics; some medications used to treat diabetes, gout, and high cholesterol.  In some cases, there are no symptoms of this condition. If the condition leads to anemia or nerve damage, various symptoms can occur, such as weakness or fatigue, shortness of breath, and numbness or tingling in your hands and feet.   Treatment:  May include taking vitamin B12 supplements.  Avoid alcohol.  Eat lots of healthy foods that contain vitamin B12: Beef, pork, chicken, [place], and organ meats, such as liver.  Seafood: This includes clams, rainbow trout, salmon, tuna, and haddock. Eggs.  Cereal and dairy products that are fortified: This means that vitamin B12 has been added to the food.    3. Mixed hyperlipidemia Cardiovascular risk and specific lipid/LDL goals reviewed.  We discussed several lifestyle modifications today and [name] will continue  to work on diet, exercise and weight loss efforts. Orders and follow up as documented in patient record.   Counseling Intensive lifestyle modifications are the first line treatment for this issue. Dietary changes: Increase soluble fiber. Decrease simple carbohydrates. Exercise changes: Moderate to vigorous-intensity aerobic activity 150 minutes per week if tolerated. Lipid-lowering medications: see  documented in medical record.  4. Obesity with current BMI of 36.0 Cathy Vargas is currently in the action stage of change. As such, her goal is to continue with weight loss efforts. She has agreed to the Category 3 Plan.   Cathy Vargas will continue [employer]. She will call pharmacies to see if she can find 0.5 mg.   Exercise goals:  As is.   Behavioral modification strategies: no skipping meals, meal planning and cooking strategies, and planning for success.  Cathy Vargas has agreed to follow-up with our clinic in 2 weeks. She was informed of the importance of frequent follow-up visits to maximize her success with intensive lifestyle modifications for her multiple health conditions.   Objective:   Blood pressure (!) 138/94, pulse 81, temperature 98.9 F (37.2 C), height 5\' 8"  (1.727 m), weight 236 lb (107 kg), SpO2 98 %. Body mass index is 35.88 kg/m.  General: Cooperative, alert, well developed, in no acute distress. HEENT: Conjunctivae and lids unremarkable. Cardiovascular: Regular rhythm.  Lungs: Normal work of breathing. Neurologic: No focal deficits.   Lab Results  Component Value Date   CREATININE 0.80 09/21/2021   BUN 9 09/21/2021   NA 140 09/21/2021   K 4.2 09/21/2021   CL 105 09/21/2021   CO2 22 09/21/2021   Lab Results  Component Value Date   ALT 13 09/21/2021   AST 9 09/21/2021   ALKPHOS 54 09/21/2021   BILITOT 0.3 09/21/2021   Lab Results  Component Value Date   HGBA1C 5.3 05/20/2021   HGBA1C 5.4 05/29/2018   HGBA1C 5.5 01/18/2018   Lab Results  Component Value Date   INSULIN 15.5 09/21/2021   INSULIN 27.7 (H) 05/20/2021   INSULIN 46.3 (H) 05/29/2018   INSULIN 22.0 01/18/2018   Lab Results  Component Value Date   TSH 2.740 05/20/2021   Lab Results  Component Value Date   CHOL 196 09/21/2021   HDL 46 09/21/2021   LDLCALC 122 (H) 09/21/2021   TRIG 158 (H) 09/21/2021   CHOLHDL 4.3 05/20/2021   Lab Results  Component Value Date   VD25OH 69.1 09/21/2021   VD25OH  38.8 05/20/2021   VD25OH 50.8 05/29/2018   Lab Results  Component Value Date   WBC 5.1 05/20/2021   HGB 13.9 05/20/2021   HCT 40.3 05/20/2021   MCV 87 05/20/2021   PLT 209 05/20/2021   No results found for: IRON, TIBC, FERRITIN  Attestation Statements:   Reviewed by clinician on day of visit: allergies, medications, problem list, medical history, surgical history, family history, social history, and previous encounter notes.  I, 07/18/2021, RMA, am acting as Jackson Latino for Energy manager, FNP.   I have reviewed the above documentation for accuracy and completeness, and I agree with the above. Irene Limbo, FNP

## 2021-10-19 ENCOUNTER — Ambulatory Visit: Payer: 59 | Admitting: Family

## 2021-10-19 NOTE — Telephone Encounter (Signed)
Cathy Vargas

## 2021-10-20 NOTE — Telephone Encounter (Signed)
LAST APPOINTMENT DATE: 10/13/21 NEXT APPOINTMENT DATE: 11/05/2021   [id number] - [place], [place] - [address] [place] [address] Phone: [phone] Fax: [phone]  [facility] - [place], [place] - [address] [place] [address] Phone: [phone] Fax: [phone]  Patient is requesting a refill of the following medications: Requested Prescriptions    No prescriptions requested or ordered in this encounter    Date last filled: 09/28/21 Previously prescribed by Irene Limbo  Lab Results  Component Value Date   HGBA1C 5.3 05/20/2021   HGBA1C 5.4 05/29/2018   HGBA1C 5.5 01/18/2018   Lab Results  Component Value Date   LDLCALC 122 (H) 09/21/2021   CREATININE 0.80 09/21/2021   Lab Results  Component Value Date   VD25OH 69.1 09/21/2021   VD25OH 38.8 05/20/2021   VD25OH 50.8 05/29/2018    BP Readings from Last 3 Encounters:  10/13/21 (!) 138/94  09/21/21 120/86  08/31/21 139/86

## 2021-10-22 ENCOUNTER — Other Ambulatory Visit (INDEPENDENT_AMBULATORY_CARE_PROVIDER_SITE_OTHER): Payer: Self-pay | Admitting: Nurse Practitioner

## 2021-10-22 DIAGNOSIS — E669 Obesity, unspecified: Secondary | ICD-10-CM

## 2021-10-22 MED ORDER — SAXENDA 18 MG/3ML ~~LOC~~ SOPN: 3.0000 mg | PEN_INJECTOR | Freq: Every day | SUBCUTANEOUS | 0 refills | Status: DC

## 2021-10-27 ENCOUNTER — Encounter (INDEPENDENT_AMBULATORY_CARE_PROVIDER_SITE_OTHER): Payer: Self-pay

## 2021-10-27 ENCOUNTER — Telehealth (INDEPENDENT_AMBULATORY_CARE_PROVIDER_SITE_OTHER): Payer: Self-pay | Admitting: Nurse Practitioner

## 2021-10-27 NOTE — Telephone Encounter (Signed)
Cathy Vargas - Prior authorization approved for Saxenda. Effective: 09/22/2021 - 02/19/2022. Patient sent approval message via mychart.

## 2021-11-02 ENCOUNTER — Ambulatory Visit (INDEPENDENT_AMBULATORY_CARE_PROVIDER_SITE_OTHER): Payer: 59 | Admitting: Nurse Practitioner

## 2021-11-03 ENCOUNTER — Ambulatory Visit (INDEPENDENT_AMBULATORY_CARE_PROVIDER_SITE_OTHER): Payer: 59 | Admitting: Nurse Practitioner

## 2021-11-03 ENCOUNTER — Encounter (INDEPENDENT_AMBULATORY_CARE_PROVIDER_SITE_OTHER): Payer: Self-pay | Admitting: Nurse Practitioner

## 2021-11-03 VITALS — BP 134/94 | HR 82 | Temp 98.5°F | Ht 68.0 in | Wt 238.0 lb

## 2021-11-03 DIAGNOSIS — Z6836 Body mass index (BMI) 36.0-36.9, adult: Secondary | ICD-10-CM

## 2021-11-03 DIAGNOSIS — E559 Vitamin D deficiency, unspecified: Secondary | ICD-10-CM

## 2021-11-03 DIAGNOSIS — E669 Obesity, unspecified: Secondary | ICD-10-CM

## 2021-11-03 DIAGNOSIS — E538 Deficiency of other specified B group vitamins: Secondary | ICD-10-CM

## 2021-11-03 MED ORDER — INSULIN PEN NEEDLE 31G X 5 MM MISC: 0 refills | Status: DC

## 2021-11-03 MED ORDER — VITAMIN D (ERGOCALCIFEROL) 1.25 MG (50000 UNIT) PO CAPS: 50000.0000 [IU] | ORAL_CAPSULE | ORAL | 0 refills | Status: DC

## 2021-11-04 NOTE — Progress Notes (Signed)
Chief Complaint:   OBESITY Cathy Vargas is here to discuss her progress with her obesity treatment plan along with follow-up of her obesity related diagnoses. [name] is on the Category 3 Plan and states she is following her eating plan approximately 20% of the time. [name] states she is doing 0 minutes 0 times per week.  Today's visit was #: 9 Starting weight: 249 lbs Starting date: 10/13/2021 Today's weight: 238 lbs Today's date: 11/03/2021 Total lbs lost to date: 11 Total lbs lost since last in-office visit: 0  Interim History: Cathy Vargas has recently picked up [place] and she is here today to discuss Saxenda and how to take it.  She struggles with eating breakfast due to " stomach problems".  She has no problem with following the plan for lunch and dinner.  She reports hunger and cravings.  Subjective:   1. Vitamin D deficiency [name] is taking vitamin D 50,000 units weekly.  She denies nausea, vomiting, or muscle weakness.  2. Low vitamin B12 level [name] is taking OTC vitamin B12, and she denies side effects.  Assessment/Plan:   1. Vitamin D deficiency We will refill prescription vitamin D 50,000 units weekly for 1 month.  Side effects were discussed.  - Vitamin D, Ergocalciferol, (DRISDOL) 1.25 MG (50000 UNIT) CAPS capsule; Take 1 capsule (50,000 Units total) by mouth every 7 (seven) days.  Dispense: 4 capsule; Refill: 0  2. Low vitamin B12 level [name] will continue vitamin B12 OTC.  3. Obesity with current BMI of 36.3 Cathy Vargas is currently in the action stage of change. As such, her goal is to continue with weight loss efforts. She has agreed to the Category 3 Plan.   Cathy Vargas is to start a protein shake for breakfast since she is skipping breakfast    We discussed various medication options to help Cathy Vargas with her weight loss efforts and we both agreed to start her Saxenda, and prescription for pen needles #100 were sent to the pharmacy.  Side effects were discussed, and I reviewed Saxenda and  demonstrated how to take it with the patient.  - Insulin Pen Needle 31G X 5 MM MISC; Take as directed with Saxenda  Dispense: 100 each; Refill: 0  Exercise goals: Walking.  Behavioral modification strategies: increasing lean protein intake, increasing water intake, and planning for success.  Cathy Vargas has agreed to follow-up with our clinic in 3 weeks. She was informed of the importance of frequent follow-up visits to maximize her success with intensive lifestyle modifications for her multiple health conditions.   Objective:   Blood pressure (!) 134/94, pulse 82, temperature 98.5 F (36.9 C), height 5\' 8"  (1.727 m), weight 238 lb (108 kg), SpO2 94 %. Body mass index is 36.19 kg/m.  General: Cooperative, alert, well developed, in no acute distress. HEENT: Conjunctivae and lids unremarkable. Cardiovascular: Regular rhythm.  Lungs: Normal work of breathing. Neurologic: No focal deficits.   Lab Results  Component Value Date   CREATININE 0.80 09/21/2021   BUN 9 09/21/2021   NA 140 09/21/2021   K 4.2 09/21/2021   CL 105 09/21/2021   CO2 22 09/21/2021   Lab Results  Component Value Date   ALT 13 09/21/2021   AST 9 09/21/2021   ALKPHOS 54 09/21/2021   BILITOT 0.3 09/21/2021   Lab Results  Component Value Date   HGBA1C 5.3 05/20/2021   HGBA1C 5.4 05/29/2018   HGBA1C 5.5 01/18/2018   Lab Results  Component Value Date   INSULIN 15.5 09/21/2021  INSULIN 27.7 (H) 05/20/2021   INSULIN 46.3 (H) 05/29/2018   INSULIN 22.0 01/18/2018   Lab Results  Component Value Date   TSH 2.740 05/20/2021   Lab Results  Component Value Date   CHOL 196 09/21/2021   HDL 46 09/21/2021   LDLCALC 122 (H) 09/21/2021   TRIG 158 (H) 09/21/2021   CHOLHDL 4.3 05/20/2021   Lab Results  Component Value Date   VD25OH 69.1 09/21/2021   VD25OH 38.8 05/20/2021   VD25OH 50.8 05/29/2018   Lab Results  Component Value Date   WBC 5.1 05/20/2021   HGB 13.9 05/20/2021   HCT 40.3 05/20/2021   MCV 87  05/20/2021   PLT 209 05/20/2021   No results found for: "IRON", "TIBC", "FERRITIN"  Attestation Statements:   Reviewed by clinician on day of visit: allergies, medications, problem list, medical history, surgical history, family history, social history, and previous encounter notes.   Trude Mcburney, am acting as Energy manager for [employer], FNP-C.  I have reviewed the above documentation for accuracy and completeness, and I agree with the above. Irene Limbo, FNP

## 2021-11-05 ENCOUNTER — Ambulatory Visit (INDEPENDENT_AMBULATORY_CARE_PROVIDER_SITE_OTHER): Payer: 59 | Admitting: Nurse Practitioner

## 2021-11-23 ENCOUNTER — Other Ambulatory Visit: Payer: Self-pay | Admitting: Family

## 2021-11-30 ENCOUNTER — Ambulatory Visit (INDEPENDENT_AMBULATORY_CARE_PROVIDER_SITE_OTHER): Payer: 59 | Admitting: Nurse Practitioner

## 2021-12-16 ENCOUNTER — Encounter (INDEPENDENT_AMBULATORY_CARE_PROVIDER_SITE_OTHER): Payer: Self-pay

## 2021-12-23 ENCOUNTER — Encounter (INDEPENDENT_AMBULATORY_CARE_PROVIDER_SITE_OTHER): Payer: Self-pay | Admitting: Nurse Practitioner

## 2021-12-23 ENCOUNTER — Ambulatory Visit (INDEPENDENT_AMBULATORY_CARE_PROVIDER_SITE_OTHER): Payer: 59 | Admitting: Nurse Practitioner

## 2021-12-23 VITALS — BP 140/89 | HR 72 | Temp 98.0°F | Ht 68.0 in | Wt 234.0 lb

## 2021-12-23 DIAGNOSIS — Z6835 Body mass index (BMI) 35.0-35.9, adult: Secondary | ICD-10-CM

## 2021-12-23 DIAGNOSIS — E669 Obesity, unspecified: Secondary | ICD-10-CM | POA: Diagnosis not present

## 2021-12-23 DIAGNOSIS — E559 Vitamin D deficiency, unspecified: Secondary | ICD-10-CM | POA: Diagnosis not present

## 2021-12-23 MED ORDER — VITAMIN D (ERGOCALCIFEROL) 1.25 MG (50000 UNIT) PO CAPS: 50000.0000 [IU] | ORAL_CAPSULE | ORAL | 0 refills | Status: DC

## 2021-12-23 MED ORDER — WEGOVY 1.7 MG/0.75ML ~~LOC~~ SOAJ: 1.7000 mg | SUBCUTANEOUS | 0 refills | Status: DC

## 2021-12-24 ENCOUNTER — Encounter (INDEPENDENT_AMBULATORY_CARE_PROVIDER_SITE_OTHER): Payer: Self-pay | Admitting: Nurse Practitioner

## 2021-12-28 ENCOUNTER — Encounter (INDEPENDENT_AMBULATORY_CARE_PROVIDER_SITE_OTHER): Payer: Self-pay

## 2021-12-28 ENCOUNTER — Telehealth (INDEPENDENT_AMBULATORY_CARE_PROVIDER_SITE_OTHER): Payer: Self-pay | Admitting: Nurse Practitioner

## 2021-12-28 NOTE — Telephone Encounter (Signed)
Prior authorization completed and approved Wegovy. Patient sent approval message via mychart. Thanks!

## 2021-12-28 NOTE — Telephone Encounter (Signed)
Irene Limbo - Prior authorization approved for [id number]. Effective: 11/28/21 - 07/26/2022. Patient sent approval message via mychart.

## 2021-12-29 NOTE — Telephone Encounter (Signed)
Noted  

## 2021-12-31 NOTE — Progress Notes (Signed)
Chief Complaint:   OBESITY Cathy Vargas is here to discuss her progress with her obesity treatment plan along with follow-up of her obesity related diagnoses. Cathy Vargas is on the Category 3 Plan and states she is following her eating plan approximately 60% of the time. Man states she is walking 30 minutes 2 times per week.  Today's visit was #: 10 Starting weight: 249 lbs Starting date: 10/13/2021 Today's weight: 234 lbs Today's date: 12/23/2021 Total lbs lost to date: 15 lbs Total lbs lost since last in-office visit: 4  Interim History: Cathy Vargas has done well with weight loss. She is taking Saxenda 3 mg. She started Saxenda 11/13/21. Reports side effects of headache 3 days per week. She is not skipping meals. She is substituting a protein shake or smoothie for breakfast. Focusing on eating more protein and doing better with hunger. Still having sugar cravings.  Subjective:   1. Vitamin D deficiency Cathy Vargas is currently taking prescription Vit D 50,000 IU once a week. Denies any nausea, vomiting or muscle weakness.  Assessment/Plan:   1. Vitamin D deficiency We will refill Vit d 50,000 IU once a week for 1 month with 0 refills.  Low Vitamin D level contributes to fatigue and are associated with obesity, breast, and colon cancer. She agrees to continue to take prescription Vitamin D @50 ,000 IU every week and will follow-up for routine testing of Vitamin D, at least 2-3 times per year to avoid over-replacement.   -Refill Vitamin D, Ergocalciferol, (DRISDOL) 1.25 MG (50000 UNIT) CAPS capsule; Take 1 capsule (50,000 Units total) by mouth every 7 (seven) days.  Dispense: 4 capsule; Refill: 0  2. Obesity with current BMI of 35.6 Cathy Vargas will STOP Saxenda and START Wegovy 1.7 mg once weekly with 0 refills.  -Start Semaglutide-Weight Management (WEGOVY) 1.7 MG/0.75ML SOAJ; Inject 1.7 mg into the skin once a week.  Dispense: 3 mL; Refill: 0  Cathy Vargas is currently in the action stage of change. As such, her goal  is to continue with weight loss efforts. She has agreed to the Category 3 Plan.   Exercise goals: As is.  Behavioral modification strategies: increasing lean protein intake, increasing water intake, and no skipping meals.  Cathy Vargas has agreed to follow-up with our clinic in 4 weeks. She was informed of the importance of frequent follow-up visits to maximize her success with intensive lifestyle modifications for her multiple health conditions.   Objective:   Blood pressure (!) 140/89, pulse 72, temperature 98 F (36.7 C), height 5\' 8"  (1.727 m), weight 234 lb (106.1 kg), SpO2 98 %. Body mass index is 35.58 kg/m.  General: Cooperative, alert, well developed, in no acute distress. HEENT: Conjunctivae and lids unremarkable. Cardiovascular: Regular rhythm.  Lungs: Normal work of breathing. Neurologic: No focal deficits.   Lab Results  Component Value Date   CREATININE 0.80 09/21/2021   BUN 9 09/21/2021   NA 140 09/21/2021   K 4.2 09/21/2021   CL 105 09/21/2021   CO2 22 09/21/2021   Lab Results  Component Value Date   ALT 13 09/21/2021   AST 9 09/21/2021   ALKPHOS 54 09/21/2021   BILITOT 0.3 09/21/2021   Lab Results  Component Value Date   HGBA1C 5.3 05/20/2021   HGBA1C 5.4 05/29/2018   HGBA1C 5.5 01/18/2018   Lab Results  Component Value Date   INSULIN 15.5 09/21/2021   INSULIN 27.7 (H) 05/20/2021   INSULIN 46.3 (H) 05/29/2018   INSULIN 22.0 01/18/2018   Lab Results  Component Value Date   TSH 2.740 05/20/2021   Lab Results  Component Value Date   CHOL 196 09/21/2021   HDL 46 09/21/2021   LDLCALC 122 (H) 09/21/2021   TRIG 158 (H) 09/21/2021   CHOLHDL 4.3 05/20/2021   Lab Results  Component Value Date   VD25OH 69.1 09/21/2021   VD25OH 38.8 05/20/2021   VD25OH 50.8 05/29/2018   Lab Results  Component Value Date   WBC 5.1 05/20/2021   HGB 13.9 05/20/2021   HCT 40.3 05/20/2021   MCV 87 05/20/2021   PLT 209 05/20/2021   No results found for: "IRON",  "TIBC", "FERRITIN"  Attestation Statements:   Reviewed by clinician on day of visit: allergies, medications, problem list, medical history, surgical history, family history, social history, and previous encounter notes.  I, Brendell Tyus, RMA, am acting as transcriptionist for Irene Limbo, FNP.  I have reviewed the above documentation for accuracy and completeness, and I agree with the above. Irene Limbo, FNP

## 2022-01-21 ENCOUNTER — Ambulatory Visit (INDEPENDENT_AMBULATORY_CARE_PROVIDER_SITE_OTHER): Payer: 59 | Admitting: Nurse Practitioner

## 2022-01-21 ENCOUNTER — Encounter (INDEPENDENT_AMBULATORY_CARE_PROVIDER_SITE_OTHER): Payer: Self-pay | Admitting: Nurse Practitioner

## 2022-01-21 VITALS — BP 130/86 | HR 85 | Temp 98.2°F | Ht 68.0 in | Wt 228.0 lb

## 2022-01-21 DIAGNOSIS — I10 Essential (primary) hypertension: Secondary | ICD-10-CM

## 2022-01-21 DIAGNOSIS — E669 Obesity, unspecified: Secondary | ICD-10-CM | POA: Diagnosis not present

## 2022-01-21 DIAGNOSIS — Z6835 Body mass index (BMI) 35.0-35.9, adult: Secondary | ICD-10-CM | POA: Diagnosis not present

## 2022-01-21 DIAGNOSIS — E559 Vitamin D deficiency, unspecified: Secondary | ICD-10-CM | POA: Diagnosis not present

## 2022-01-21 MED ORDER — VITAMIN D (ERGOCALCIFEROL) 1.25 MG (50000 UNIT) PO CAPS: 50000.0000 [IU] | ORAL_CAPSULE | ORAL | 0 refills | Status: DC

## 2022-01-21 MED ORDER — WEGOVY 1.7 MG/0.75ML ~~LOC~~ SOAJ: 1.7000 mg | SUBCUTANEOUS | 0 refills | Status: DC

## 2022-01-25 NOTE — Progress Notes (Signed)
Chief Complaint:   OBESITY Cathy Vargas is here to discuss her progress with her obesity treatment plan along with follow-up of her obesity related diagnoses. Cathy Vargas is on the Category 3 Plan and states she is following her eating plan approximately 60% of the time. Cathy Vargas states she is exercising 0 minutes 0 times per week.  Today's visit was #: 11 Starting weight: 249 lbs Starting date: 10/13/2021 Today's weight: 228 lbs Today's date: 01/21/2022 Total lbs lost to date: 21 lbs Total lbs lost since last in-office visit: 6  Interim History: Cathy Vargas has done well with weight loss since her last visit. She is taking Wegovy 1.7 mg. Denies any side effects. Took Saxenda in the past and stopped due to side effects. Since starting [facility] her sugar cravings have decreased. Denies polyphagia. Not skipping meals. Meeting protein goals. Drinking a protein shake, sparkling water and water (working on drinking more).  Subjective:   1. Hypertension, unspecified type [name]'s blood pressure at home today was 121/83. Denies any chest pain,shortness of breath or palpitations. Family history: MGF (CHF). She is not on medication.  2. Vitamin D deficiency Cathy Vargas is currently taking prescription Vit D 50,000 IU once a week.   Assessment/Plan:   1. Hypertension, unspecified type Will continue to monitor. Cardiovascular risk and specific lipid/LDL goals reviewed.  We discussed several lifestyle modifications today and [name] will continue to work on diet, exercise and weight loss efforts. Orders and follow up as documented in patient record.   Counseling Intensive lifestyle modifications are the first line treatment for this issue. Dietary changes: Increase soluble fiber. Decrease simple carbohydrates. Exercise changes: Moderate to vigorous-intensity aerobic activity 150 minutes per week if tolerated. Lipid-lowering medications: see documented in medical record.   2. Vitamin D deficiency We will Refill Vit D 50k IU  once every 2 weeks for 1 month with 0 refills. Low Vitamin D level contributes to fatigue and are associated with obesity, breast, and colon cancer. She agrees to continue to take prescription Vitamin D @50 ,000 IU every week and will follow-up for routine testing of Vitamin D, at least 2-3 times per year to avoid over-replacement.   -Refill Vitamin D, Ergocalciferol, (DRISDOL) 1.25 MG (50000 UNIT) CAPS capsule; Take 1 capsule (50,000 Units total) by mouth every 14 (fourteen) days.  Dispense: 4 capsule; Refill: 0  3. Obesity with current BMI of 35.6 We will refill Wegovy 1.7 mg SubQ once a week for 1 month with 0 refills.  -Refill Semaglutide-Weight Management (WEGOVY) 1.7 MG/0.75ML SOAJ; Inject 1.7 mg into the skin once a week.  Dispense: 3 mL; Refill: 0  Cathy Vargas is currently in the action stage of change. As such, her goal is to continue with weight loss efforts. She has agreed to the Category 3 Plan.    Cathy Vargas will increase protein;muscle mass has decreased. Add resistance training.  Exercise goals: All adults should avoid inactivity. Some physical activity is better than none, and adults who participate in any amount of physical activity gain some health benefits.  Behavioral modification strategies: increasing lean protein intake, increasing vegetables, and increasing water intake.  Cathy Vargas has agreed to follow-up with our clinic in 4 weeks. She was informed of the importance of frequent follow-up visits to maximize her success with intensive lifestyle modifications for her multiple health conditions.   Objective:   Blood pressure 130/86, pulse 85, temperature 98.2 F (36.8 C), height 5\' 8"  (1.727 m), weight 228 lb (103.4 kg), SpO2 99 %. Body mass index is 34.67  kg/m.  General: Cooperative, alert, well developed, in no acute distress. HEENT: Conjunctivae and lids unremarkable. Cardiovascular: Regular rhythm.  Lungs: Normal work of breathing. Neurologic: No focal deficits.   Lab Results   Component Value Date   CREATININE 0.80 09/21/2021   BUN 9 09/21/2021   NA 140 09/21/2021   K 4.2 09/21/2021   CL 105 09/21/2021   CO2 22 09/21/2021   Lab Results  Component Value Date   ALT 13 09/21/2021   AST 9 09/21/2021   ALKPHOS 54 09/21/2021   BILITOT 0.3 09/21/2021   Lab Results  Component Value Date   HGBA1C 5.3 05/20/2021   HGBA1C 5.4 05/29/2018   HGBA1C 5.5 01/18/2018   Lab Results  Component Value Date   INSULIN 15.5 09/21/2021   INSULIN 27.7 (H) 05/20/2021   INSULIN 46.3 (H) 05/29/2018   INSULIN 22.0 01/18/2018   Lab Results  Component Value Date   TSH 2.740 05/20/2021   Lab Results  Component Value Date   CHOL 196 09/21/2021   HDL 46 09/21/2021   LDLCALC 122 (H) 09/21/2021   TRIG 158 (H) 09/21/2021   CHOLHDL 4.3 05/20/2021   Lab Results  Component Value Date   VD25OH 69.1 09/21/2021   VD25OH 38.8 05/20/2021   VD25OH 50.8 05/29/2018   Lab Results  Component Value Date   WBC 5.1 05/20/2021   HGB 13.9 05/20/2021   HCT 40.3 05/20/2021   MCV 87 05/20/2021   PLT 209 05/20/2021   No results found for: "IRON", "TIBC", "FERRITIN"  Attestation Statements:   Reviewed by clinician on day of visit: allergies, medications, problem list, medical history, surgical history, family history, social history, and previous encounter notes.  I, Brendell Tyus, RMA, am acting as transcriptionist for [name], FNP.  I have reviewed the above documentation for accuracy and completeness, and I agree with the above. [name], FNP

## 2022-02-01 ENCOUNTER — Encounter: Payer: Self-pay | Admitting: *Deleted

## 2022-02-16 ENCOUNTER — Ambulatory Visit (INDEPENDENT_AMBULATORY_CARE_PROVIDER_SITE_OTHER): Payer: 59 | Admitting: Family Medicine

## 2022-02-18 ENCOUNTER — Encounter (INDEPENDENT_AMBULATORY_CARE_PROVIDER_SITE_OTHER): Payer: Self-pay | Admitting: Family Medicine

## 2022-02-18 ENCOUNTER — Ambulatory Visit (INDEPENDENT_AMBULATORY_CARE_PROVIDER_SITE_OTHER): Payer: 59 | Admitting: Family Medicine

## 2022-02-18 VITALS — BP 130/82 | HR 83 | Temp 98.2°F | Ht 68.0 in | Wt 225.0 lb

## 2022-02-18 DIAGNOSIS — E039 Hypothyroidism, unspecified: Secondary | ICD-10-CM | POA: Diagnosis not present

## 2022-02-18 DIAGNOSIS — E782 Mixed hyperlipidemia: Secondary | ICD-10-CM | POA: Insufficient documentation

## 2022-02-18 DIAGNOSIS — E559 Vitamin D deficiency, unspecified: Secondary | ICD-10-CM | POA: Insufficient documentation

## 2022-02-18 DIAGNOSIS — Z6834 Body mass index (BMI) 34.0-34.9, adult: Secondary | ICD-10-CM

## 2022-02-18 DIAGNOSIS — Z6837 Body mass index (BMI) 37.0-37.9, adult: Secondary | ICD-10-CM | POA: Insufficient documentation

## 2022-02-18 DIAGNOSIS — E669 Obesity, unspecified: Secondary | ICD-10-CM

## 2022-02-18 DIAGNOSIS — E88819 Insulin resistance, unspecified: Secondary | ICD-10-CM | POA: Diagnosis not present

## 2022-02-18 HISTORY — DX: Morbid (severe) obesity due to excess calories: E66.01

## 2022-02-18 MED ORDER — WEGOVY 1.7 MG/0.75ML ~~LOC~~ SOAJ: 1.7000 mg | SUBCUTANEOUS | 0 refills | Status: DC

## 2022-02-19 LAB — LIPID PANEL WITH LDL/HDL RATIO
Cholesterol, Total: 176 mg/dL (ref 100–199)
HDL: 42 mg/dL (ref >39)
LDL Chol Calc (NIH): 107 mg/dL — ABNORMAL HIGH (ref 0–99)
LDL/HDL Ratio: 2.5 ratio (ref 0.0–3.2)
Triglycerides: 153 mg/dL — ABNORMAL HIGH (ref 0–149)
VLDL Cholesterol Cal: 27 mg/dL (ref 5–40)

## 2022-02-19 LAB — INSULIN, RANDOM: INSULIN: 13.5 u[IU]/mL (ref 2.6–24.9)

## 2022-02-19 LAB — CMP14+EGFR
ALT: 18 IU/L (ref 0–32)
AST: 15 IU/L (ref 0–40)
Albumin/Globulin Ratio: 1.7 (ref 1.2–2.2)
Albumin: 4.2 g/dL (ref 3.9–4.9)
Alkaline Phosphatase: 63 IU/L (ref 44–121)
BUN/Creatinine Ratio: 10 (ref 9–23)
BUN: 8 mg/dL (ref 6–20)
Bilirubin Total: 0.2 mg/dL (ref 0.0–1.2)
CO2: 19 mmol/L — ABNORMAL LOW (ref 20–29)
Calcium: 9.2 mg/dL (ref 8.7–10.2)
Chloride: 102 mmol/L (ref 96–106)
Creatinine, Ser: 0.83 mg/dL (ref 0.57–1.00)
Globulin, Total: 2.5 g/dL (ref 1.5–4.5)
Glucose: 88 mg/dL (ref 70–99)
Potassium: 4.1 mmol/L (ref 3.5–5.2)
Sodium: 138 mmol/L (ref 134–144)
Total Protein: 6.7 g/dL (ref 6.0–8.5)
eGFR: 92 mL/min/{1.73_m2} (ref >59)

## 2022-02-19 LAB — HEMOGLOBIN A1C
Est. average glucose Bld gHb Est-mCnc: 105 mg/dL
Hgb A1c MFr Bld: 5.3 % (ref 4.8–5.6)

## 2022-02-19 LAB — VITAMIN D 25 HYDROXY (VIT D DEFICIENCY, FRACTURES): Vit D, 25-Hydroxy: 80.8 ng/mL (ref 30.0–100.0)

## 2022-02-19 LAB — TSH: TSH: 1.93 u[IU]/mL (ref 0.450–4.500)

## 2022-02-24 NOTE — Progress Notes (Signed)
Chief Complaint:   OBESITY Cathy Vargas is here to discuss her progress with her obesity treatment plan along with follow-up of her obesity related diagnoses. Cathy Vargas is on the Category 3 Plan and states she is following her eating plan approximately 60% of the time. Cathy Vargas states she is doing weights and cardio for 15-20 minutes 3 times per week.  Today's visit was #: 12 Starting weight: 249 lbs Starting date: 10/13/2021 Today's weight: 225 lbs Today's date: 02/18/2022 Total lbs lost to date: 24 Total lbs lost since last in-office visit: 3  Interim History: Cathy Vargas is doing very well with weight loss.  She notes Mancel Parsons is helping a lot with her hunger and cravings.  No side effects were noted.  Subjective:   1. Vitamin D deficiency Cathy Vargas decreased her vitamin D to once every 2 weeks at her last visit.  Her vitamin D level has been at goal, no side effects were noted.  She is at risk for oversupplementation.  2. Acquired hypothyroidism Cathy Vargas is taking Synthroid 150 mcg daily.  Last TSH was in May, at goal.  No side effects were noted.  3. Insulin resistance Cathy Vargas is doing very well on her GLP-1 with no side effects noted.  4. Mixed hyperlipidemia Cathy Vargas's levels have been improving with her eating plan and continued weight loss.  She is not on statin.  Assessment/Plan:   1. Vitamin D deficiency We will check labs today.  Cathy Vargas will continue prescription vitamin D 50,000 units once every 14 days.  - VITAMIN D 25 Hydroxy (Vit-D Deficiency, Fractures)  2. Acquired hypothyroidism We will check labs today, and we will follow-[facility] at [facility] visit.  - TSH  3. Insulin resistance We will check labs today, and Cathy Vargas will continue with her eating plan and exercise.  - CMP14+EGFR - Insulin, random - Hemoglobin A1c  4. Mixed hyperlipidemia We will check labs today, and Cathy Vargas will continue with her eating plan and exercise.  - Lipid Panel With LDL/HDL Ratio  5. Obesity, Current BMI  34.3 Cathy Vargas is currently in the action stage of change. As such, her goal is to continue with weight loss efforts. She has agreed to the Category 3 Plan.   We discussed various medication options to help Aby with her weight loss efforts and we both agreed to continue Wegovy 1.7 mg once weekly, and we will refill for 1 month.  - Semaglutide-Weight Management (WEGOVY) 1.7 MG/0.75ML SOAJ; Inject 1.7 mg into the skin once a week.  Dispense: 3 mL; Refill: 0  Exercise goals: As is.  Behavioral modification strategies: increasing lean protein intake, meal planning and cooking strategies, and avoiding temptations.  Brinna has agreed to follow-up with our clinic in 3 to 4 weeks. She was informed of the importance of frequent follow-up visits to maximize her success with intensive lifestyle modifications for her multiple health conditions.   Cathy Vargas was informed we would discuss her lab results at her next visit unless there is a critical issue that needs to be addressed sooner. Cathy Vargas agreed to keep her next visit at the agreed upon time to discuss these results.  Objective:   Blood pressure 130/82, pulse 83, temperature 98.2 F (36.8 C), height $RemoveBe'5\' 8"'[id number]$  (1.727 m), weight 225 lb (102.1 kg), SpO2 97 %. Body mass index is 34.21 kg/m.  General: Cooperative, alert, well developed, in no acute distress. HEENT: Conjunctivae and lids unremarkable. Cardiovascular: Regular rhythm.  Lungs: Normal work of breathing. Neurologic: No focal deficits.   Lab  Results  Component Value Date   CREATININE 0.83 02/18/2022   BUN 8 02/18/2022   NA 138 02/18/2022   K 4.1 02/18/2022   CL 102 02/18/2022   CO2 19 (L) 02/18/2022   Lab Results  Component Value Date   ALT 18 02/18/2022   AST 15 02/18/2022   ALKPHOS 63 02/18/2022   BILITOT 0.2 02/18/2022   Lab Results  Component Value Date   HGBA1C 5.3 02/18/2022   HGBA1C 5.3 05/20/2021   HGBA1C 5.4 05/29/2018   HGBA1C 5.5 01/18/2018   Lab Results  Component Value  Date   INSULIN 13.5 02/18/2022   INSULIN 15.5 09/21/2021   INSULIN 27.7 (H) 05/20/2021   INSULIN 46.3 (H) 05/29/2018   INSULIN 22.0 01/18/2018   Lab Results  Component Value Date   TSH 1.930 02/18/2022   Lab Results  Component Value Date   CHOL 176 02/18/2022   HDL 42 02/18/2022   LDLCALC 107 (H) 02/18/2022   TRIG 153 (H) 02/18/2022   CHOLHDL 4.3 05/20/2021   Lab Results  Component Value Date   VD25OH 80.8 02/18/2022   VD25OH 69.1 09/21/2021   VD25OH 38.8 05/20/2021   Lab Results  Component Value Date   WBC 5.1 05/20/2021   HGB 13.9 05/20/2021   HCT 40.3 05/20/2021   MCV 87 05/20/2021   PLT 209 05/20/2021   No results found for: "IRON", "TIBC", "FERRITIN"  Attestation Statements:   Reviewed by clinician on day of visit: allergies, medications, problem list, medical history, surgical history, family history, social history, and previous encounter notes.   I, Trixie Dredge, am acting as transcriptionist for Dennard Nip, MD.  I have reviewed the above documentation for accuracy and completeness, and I agree with the above. -  Dennard Nip, MD

## 2022-03-17 ENCOUNTER — Encounter (INDEPENDENT_AMBULATORY_CARE_PROVIDER_SITE_OTHER): Payer: Self-pay | Admitting: Family Medicine

## 2022-03-17 ENCOUNTER — Ambulatory Visit (INDEPENDENT_AMBULATORY_CARE_PROVIDER_SITE_OTHER): Payer: 59 | Admitting: Family Medicine

## 2022-03-17 VITALS — BP 136/86 | HR 85 | Temp 98.3°F | Ht 68.0 in | Wt 220.0 lb

## 2022-03-17 DIAGNOSIS — Z6833 Body mass index (BMI) 33.0-33.9, adult: Secondary | ICD-10-CM | POA: Diagnosis not present

## 2022-03-17 DIAGNOSIS — E88819 Insulin resistance, unspecified: Secondary | ICD-10-CM

## 2022-03-17 DIAGNOSIS — E559 Vitamin D deficiency, unspecified: Secondary | ICD-10-CM

## 2022-03-17 DIAGNOSIS — E669 Obesity, unspecified: Secondary | ICD-10-CM | POA: Diagnosis not present

## 2022-03-17 MED ORDER — WEGOVY 1.7 MG/0.75ML ~~LOC~~ SOAJ: 1.7000 mg | SUBCUTANEOUS | 0 refills | Status: DC

## 2022-03-17 MED ORDER — VITAMIN D (ERGOCALCIFEROL) 1.25 MG (50000 UNIT) PO CAPS: 50000.0000 [IU] | ORAL_CAPSULE | ORAL | 0 refills | Status: DC

## 2022-03-18 ENCOUNTER — Ambulatory Visit (INDEPENDENT_AMBULATORY_CARE_PROVIDER_SITE_OTHER): Payer: 59 | Admitting: Family Medicine

## 2022-03-30 NOTE — Progress Notes (Signed)
Chief Complaint:   OBESITY Cathy Vargas is here to discuss her progress with her obesity treatment plan along with follow-up of her obesity related diagnoses. Cathy Vargas is on the Category 3 Plan and states she is following her eating plan approximately 50% of the time. Cathy Vargas states she is doing aerobics for 20 minutes 1-2 times per week.  Today's visit was #: 12 Starting weight: 249 lbs Starting date: 10/13/2021 Today's weight: 220 lbs Today's date: 03/17/2022 Total lbs lost to date: 29 Total lbs lost since last in-office visit: 5  Interim History: Cathy Vargas has done well with her weight loss. She had some celebration eating, but she was able to get back on track. She is taking Wegovy with no side effects noted.   Subjective:   1. Insulin resistance Cathy Vargas's last A1c was 5.3 and insulin level 13.5, improved. She is taking GLP-1 without side effects.   2. Vitamin D deficiency Cathy Vargas is taking Vitamin D prescription every weekly, with no side effects noted.   Assessment/Plan:   1. Insulin resistance Cathy Vargas will continue with her diet, exercise, and GLP-1.  2. Vitamin D deficiency Cathy Vargas will continue prescription Vitamin D 50,000 IU every week, and we will refill for 1 month. She will follow-up for routine testing of Vitamin D, at least 2-3 times per year to avoid over-replacement.  - Vitamin D, Ergocalciferol, (DRISDOL) 1.25 MG (50000 UNIT) CAPS capsule; Take 1 capsule (50,000 Units total) by mouth every 7 (seven) days.  Dispense: 4 capsule; Refill: 0  3. Obesity, Current BMI 33.5 Cathy Vargas is currently in the action stage of change. As such, her goal is to continue with weight loss efforts. She has agreed to the Category 3 Plan.   Cathy Vargas will continue Wegovy 1.7 mg once weekly, and we will refill for 1 month.   - Semaglutide-Weight Management (WEGOVY) 1.7 MG/0.75ML SOAJ; Inject 1.7 mg into the skin once a week.  Dispense: 3 mL; Refill: 0  Exercise goals: As is.   Behavioral modification strategies:  increasing lean protein intake, decreasing simple carbohydrates, and holiday eating strategies .  Cathy Vargas has agreed to follow-up with our clinic in 4 weeks. She was informed of the importance of frequent follow-up visits to maximize her success with intensive lifestyle modifications for her multiple health conditions.   Objective:   Blood pressure 136/86, pulse 85, temperature 98.3 F (36.8 C), height 5\' 8"  (1.727 m), weight 220 lb (99.8 kg), SpO2 98 %. Body mass index is 33.45 kg/m.  General: Cooperative, alert, well developed, in no acute distress. HEENT: Conjunctivae and lids unremarkable. Cardiovascular: Regular rhythm.  Lungs: Normal work of breathing. Neurologic: No focal deficits.   Lab Results  Component Value Date   CREATININE 0.83 02/18/2022   BUN 8 02/18/2022   NA 138 02/18/2022   K 4.1 02/18/2022   CL 102 02/18/2022   CO2 19 (L) 02/18/2022   Lab Results  Component Value Date   ALT 18 02/18/2022   AST 15 02/18/2022   ALKPHOS 63 02/18/2022   BILITOT 0.2 02/18/2022   Lab Results  Component Value Date   HGBA1C 5.3 02/18/2022   HGBA1C 5.3 05/20/2021   HGBA1C 5.4 05/29/2018   HGBA1C 5.5 01/18/2018   Lab Results  Component Value Date   INSULIN 13.5 02/18/2022   INSULIN 15.5 09/21/2021   INSULIN 27.7 (H) 05/20/2021   INSULIN 46.3 (H) 05/29/2018   INSULIN 22.0 01/18/2018   Lab Results  Component Value Date   TSH 1.930 02/18/2022  Lab Results  Component Value Date   CHOL 176 02/18/2022   HDL 42 02/18/2022   LDLCALC 107 (H) 02/18/2022   TRIG 153 (H) 02/18/2022   CHOLHDL 4.3 05/20/2021   Lab Results  Component Value Date   VD25OH 80.8 02/18/2022   VD25OH 69.1 09/21/2021   VD25OH 38.8 05/20/2021   Lab Results  Component Value Date   WBC 5.1 05/20/2021   HGB 13.9 05/20/2021   HCT 40.3 05/20/2021   MCV 87 05/20/2021   PLT 209 05/20/2021   No results found for: "IRON", "TIBC", "FERRITIN"  Attestation Statements:   Reviewed by clinician on day  of visit: allergies, medications, problem list, medical history, surgical history, family history, social history, and previous encounter notes.   I, Trixie Dredge, am acting as transcriptionist for Dennard Nip, MD.  I have reviewed the above documentation for accuracy and completeness, and I agree with the above. -  Dennard Nip, MD

## 2022-04-15 ENCOUNTER — Ambulatory Visit (INDEPENDENT_AMBULATORY_CARE_PROVIDER_SITE_OTHER): Payer: 59 | Admitting: Family Medicine

## 2022-04-15 ENCOUNTER — Encounter (INDEPENDENT_AMBULATORY_CARE_PROVIDER_SITE_OTHER): Payer: Self-pay | Admitting: Family Medicine

## 2022-04-15 VITALS — BP 126/86 | HR 86 | Temp 98.0°F | Ht 68.0 in | Wt 217.0 lb

## 2022-04-15 DIAGNOSIS — E039 Hypothyroidism, unspecified: Secondary | ICD-10-CM | POA: Diagnosis not present

## 2022-04-15 DIAGNOSIS — E559 Vitamin D deficiency, unspecified: Secondary | ICD-10-CM

## 2022-04-15 DIAGNOSIS — Z6833 Body mass index (BMI) 33.0-33.9, adult: Secondary | ICD-10-CM

## 2022-04-15 DIAGNOSIS — E669 Obesity, unspecified: Secondary | ICD-10-CM | POA: Diagnosis not present

## 2022-04-15 MED ORDER — WEGOVY 1.7 MG/0.75ML ~~LOC~~ SOAJ: 1.7000 mg | SUBCUTANEOUS | 0 refills | Status: DC

## 2022-04-15 MED ORDER — VITAMIN D (ERGOCALCIFEROL) 1.25 MG (50000 UNIT) PO CAPS: 50000.0000 [IU] | ORAL_CAPSULE | ORAL | 0 refills | Status: DC

## 2022-04-21 ENCOUNTER — Ambulatory Visit (INDEPENDENT_AMBULATORY_CARE_PROVIDER_SITE_OTHER): Payer: 59 | Admitting: Family

## 2022-04-21 ENCOUNTER — Encounter: Payer: Self-pay | Admitting: Family

## 2022-04-21 VITALS — BP 123/84 | HR 87 | Temp 97.6°F | Ht 68.0 in | Wt 220.1 lb

## 2022-04-21 DIAGNOSIS — E669 Obesity, unspecified: Secondary | ICD-10-CM | POA: Diagnosis not present

## 2022-04-21 DIAGNOSIS — E039 Hypothyroidism, unspecified: Secondary | ICD-10-CM | POA: Diagnosis not present

## 2022-04-21 DIAGNOSIS — Z0000 Encounter for general adult medical examination without abnormal findings: Secondary | ICD-10-CM

## 2022-04-21 DIAGNOSIS — Z6837 Body mass index (BMI) 37.0-37.9, adult: Secondary | ICD-10-CM

## 2022-04-21 MED ORDER — LEVOTHYROXINE SODIUM 150 MCG PO TABS: 150.0000 ug | ORAL_TABLET | Freq: Every day | ORAL | 3 refills | Status: DC

## 2022-04-21 NOTE — Progress Notes (Signed)
Phone [phone]  Subjective:   Patient is a 36 y.o. female presenting for annual physical.    Chief Complaint  Patient presents with   Annual Exam    Fasting W/ Labs    See problem oriented charting- ROS- full  review of systems was completed and negative   The following were reviewed and entered/updated in epic: Past Medical History:  Diagnosis Date   Abnormal Pap smear of cervix 2015   Anxiety    Back pain    Chest pain    Class 2 severe obesity with serious comorbidity and body mass index (BMI) of 37.0 to 37.9 in adult (HCC) 02/18/2022   Constipation    Gallbladder disease    Gallbladder problem    HTN (hypertension)    Hyperlipemia    Hypothyroidism    Joint pain    Joint pain    PCOS (polycystic ovarian syndrome)    Sciatica    Thyroid disease    Vitamin D deficiency    Patient Active Problem List   Diagnosis Date Noted   Vitamin D deficiency 02/18/2022   Insulin resistance 02/18/2022   Mixed hyperlipidemia 02/18/2022   Obesity (BMI 30-39.9) 04/20/2021   Annual physical exam 04/20/2021   Acquired hypothyroidism 04/20/2021   Past Surgical History:  Procedure Laterality Date   CHOLECYSTECTOMY  2010   DILATION AND CURETTAGE OF UTERUS  2015   OOPHORECTOMY     WISDOM TOOTH EXTRACTION  2001    Family History  Problem Relation Age of Onset   Obesity Mother    Obesity Father    Hyperlipidemia Father    Alcoholism Father    Drug abuse Brother    Stroke Maternal Grandmother    Heart disease Maternal Grandfather    Cancer Paternal Grandfather     Medications- reviewed and updated Current Outpatient Medications  Medication Sig Dispense Refill   cholecalciferol (VITAMIN D3) 25 MCG (1000 UNIT) tablet Take 1,000 Units by mouth daily.     loratadine (CLARITIN) 10 MG tablet Take 10 mg by mouth daily.     Norethindrone-Ethinyl Estradiol-Fe Biphas (LO LOESTRIN FE) 1 MG-10 MCG / 10 MCG tablet Take 1 tablet by mouth daily.     Semaglutide-Weight Management  (WEGOVY) 1.7 MG/0.75ML SOAJ Inject 1.7 mg into the skin once a week. 3 mL 0   Vitamin D, Ergocalciferol, (DRISDOL) 1.25 MG (50000 UNIT) CAPS capsule Take 1 capsule (50,000 Units total) by mouth every 7 (seven) days. 4 capsule 0   levothyroxine (SYNTHROID) 150 MCG tablet Take 1 tablet (150 mcg total) by mouth daily before breakfast. 90 tablet 3   No current facility-administered medications for this visit.    Allergies-reviewed and updated No Known Allergies  Social History   Social History Narrative   Not on file    Objective:  BP 123/84 (BP Location: Left Arm, Patient Position: Sitting, Cuff Size: Normal)   Pulse 87   Temp 97.6 F (36.4 C) (Temporal)   Ht 5\' 8"  (1.727 m)   Wt 220 lb 2 oz (99.8 kg)   LMP 03/03/2022 (Approximate)   SpO2 99%   BMI 33.47 kg/m  Physical Exam Vitals and nursing note reviewed.  Constitutional:      Appearance: Normal appearance.  HENT:     Head: Normocephalic.     Right Ear: Tympanic membrane normal.     Left Ear: Tympanic membrane normal.     Nose: Nose normal.     Mouth/Throat:     Mouth: Mucous membranes are  moist.  Eyes:     Pupils: Pupils are equal, round, and reactive to light.  Cardiovascular:     Rate and Rhythm: Normal rate and regular rhythm.  Pulmonary:     Effort: Pulmonary effort is normal.     Breath sounds: Normal breath sounds.  Musculoskeletal:        General: Normal range of motion.     Cervical back: Normal range of motion.  Lymphadenopathy:     Cervical: No cervical adenopathy.  Skin:    General: Skin is warm and dry.  Neurological:     Mental Status: She is alert.  Psychiatric:        Mood and Affect: Mood normal.        Behavior: Behavior normal.      Assessment and Plan   Health Maintenance counseling: 1. Anticipatory guidance: Patient counseled regarding regular dental exams q6 months, eye exams,  avoiding smoking and second hand smoke, limiting alcohol to 1 beverage per day, no illicit drugs.   2.  Risk factor reduction:  Advised patient of need for regular exercise and diet rich with fruits and vegetables to reduce risk of heart attack and stroke. Wt Readings from Last 3 Encounters:  04/21/22 220 lb 2 oz (99.8 kg)  04/15/22 217 lb (98.4 kg)  03/17/22 220 lb (99.8 kg)   3. Immunizations/screenings/ancillary studies Immunization History  Administered Date(s) Administered   PFIZER(Purple Top)SARS-COV-2 Vaccination 08/24/2019, 09/14/2019, 04/18/2020   Td 10/10/2017   Health Maintenance Due  Topic Date Due   HIV Screening  Never done   Hepatitis C Screening  Never done   PAP SMEAR-Modifier  09/02/2019    4. Cervical cancer screening: 04/2021 5. Skin cancer screening- advised regular sunscreen use. Denies worrisome, changing, or new skin lesions. Recommend pt see Dermatology for screening due to fair skin. 6. Birth control/STD check: OCP 7. Smoking associated screening: non-smoker 8. Alcohol screening: none  Problem List Items Addressed This Visit       Endocrine   Acquired hypothyroidism    chronic TSH wnl in October, T4 checked 05/2021  continue same dose advised to have rechecked w/next wt loss lab work d/t wt loss no refill needed today f/u 16yr or prn      Relevant Medications   levothyroxine (SYNTHROID) 150 MCG tablet     Other   Obesity (BMI 30-39.9)    chronic pt has re-established with wt loss clinic wt down 29lbs since first of year tolerating wegovy, will start exercise routine first of year, currently walking continue to advise on wt loss strategies f/u prn      Annual physical exam - Primary pt had complete lab work done in Oct for wt loss clinic - all wnl - triglycerides & LDL were lower, TSH wnl, no concerns.      Recommended follow up:  Return in about 1 year (around 04/22/2023) for thyroid, any future concerns. Future Appointments  Date Time Provider Department Center  05/11/2022  3:45 PM Rayburn, Fanny Bien, PA-C [facility] None  06/02/2022   7:40 AM Wilder Glade, MD [facility] None  04/25/2023  8:00 AM Dulce Sellar, NP [facility]    Lab/Order associations:fasting    Dulce Sellar, NP

## 2022-04-21 NOTE — Patient Instructions (Signed)
It was very nice to see you today!   Have a wonderful holiday!     PLEASE NOTE:  If you had any lab tests please let us know if you have not heard back within a few days. You may see your results on MyChart before we have a chance to review them but we will give you a call once they are reviewed by us. If we ordered any referrals today, please let us know if you have not heard from their office within the next week.    

## 2022-04-21 NOTE — Assessment & Plan Note (Signed)
chronic pt has re-established with wt loss clinic wt down 29lbs since first of year tolerating wegovy, will start exercise routine first of year, currently walking continue to advise on wt loss strategies f/u prn

## 2022-04-21 NOTE — Assessment & Plan Note (Signed)
chronic TSH wnl in October, T4 checked 05/2021  continue same dose advised to have rechecked w/next wt loss lab work d/t wt loss no refill needed today f/u 77yr or prn

## 2022-04-26 NOTE — Progress Notes (Signed)
Chief Complaint:   OBESITY Cathy Vargas is here to discuss her progress with her obesity treatment plan along with follow-up of her obesity related diagnoses. Cathy Vargas is on the Category 3 Plan and states she is following her eating plan approximately 40% of the time. Cathy Vargas states she is doing 0 minutes 0 times per week.  Today's visit was #: 13 Starting weight: 249 lbs Starting date: 10/13/2021 Today's weight: 217 lbs Today's date: 04/15/2022 Total lbs lost to date: 32 Total lbs lost since last in-office visit: 3  Interim History: Cathy Vargas did very well with weight loss even over Thanksgiving. She decreased the volume and decreased leftovers. Her husband is helping decrease sweets in the home.   Subjective:   1. Acquired hypothyroidism Cathy Vargas is on Synthroid and her last labs were at goal. She is showing no signs of over-replacement.   2. Vitamin D deficiency Cathy Vargas is on Vitamin D prescription every other week. Her last level at goal, and she denies nausea, vomiting, or muscle weakness.   Assessment/Plan:   1. Acquired hypothyroidism Cathy Vargas will continue Synthroid as is, and will continue to monitor.   2. Vitamin D deficiency We will refill prescription Vitamin D 50,000 IU for 1 month; will continue to monitor.   - Vitamin D, Ergocalciferol, (DRISDOL) 1.25 MG (50000 UNIT) CAPS capsule; Take 1 capsule (50,000 Units total) by mouth every 7 (seven) days.  Dispense: 4 capsule; Refill: 0  3. Obesity, Current BMI 33.1 Cathy Vargas is currently in the action stage of change. As such, her goal is to continue with weight loss efforts. She has agreed to the Category 3 Plan.   Cathy Vargas will continue Wegovy 1.7 mg once weekly, and we will refill for 1 month.   - Semaglutide-Weight Management (WEGOVY) 1.7 MG/0.75ML SOAJ; Inject 1.7 mg into the skin once a week.  Dispense: 3 mL; Refill: 0  Behavioral modification strategies: increasing lean protein intake, dealing with family or coworker sabotage, and celebration  eating strategies.  Cathy Vargas has agreed to follow-up with our clinic in 4 weeks. She was informed of the importance of frequent follow-up visits to maximize her success with intensive lifestyle modifications for her multiple health conditions.   Objective:   Blood pressure 126/86, pulse 86, temperature 98 F (36.7 C), height 5\' 8"  (1.727 m), weight 217 lb (98.4 kg), SpO2 98 %. Body mass index is 32.99 kg/m.  General: Cooperative, alert, well developed, in no acute distress. HEENT: Conjunctivae and lids unremarkable. Cardiovascular: Regular rhythm.  Lungs: Normal work of breathing. Neurologic: No focal deficits.   Lab Results  Component Value Date   CREATININE 0.83 02/18/2022   BUN 8 02/18/2022   NA 138 02/18/2022   K 4.1 02/18/2022   CL 102 02/18/2022   CO2 19 (L) 02/18/2022   Lab Results  Component Value Date   ALT 18 02/18/2022   AST 15 02/18/2022   ALKPHOS 63 02/18/2022   BILITOT 0.2 02/18/2022   Lab Results  Component Value Date   HGBA1C 5.3 02/18/2022   HGBA1C 5.3 05/20/2021   HGBA1C 5.4 05/29/2018   HGBA1C 5.5 01/18/2018   Lab Results  Component Value Date   INSULIN 13.5 02/18/2022   INSULIN 15.5 09/21/2021   INSULIN 27.7 (H) 05/20/2021   INSULIN 46.3 (H) 05/29/2018   INSULIN 22.0 01/18/2018   Lab Results  Component Value Date   TSH 1.930 02/18/2022   Lab Results  Component Value Date   CHOL 176 02/18/2022   HDL 42 02/18/2022  LDLCALC 107 (H) 02/18/2022   TRIG 153 (H) 02/18/2022   CHOLHDL 4.3 05/20/2021   Lab Results  Component Value Date   VD25OH 80.8 02/18/2022   VD25OH 69.1 09/21/2021   VD25OH 38.8 05/20/2021   Lab Results  Component Value Date   WBC 5.1 05/20/2021   HGB 13.9 05/20/2021   HCT 40.3 05/20/2021   MCV 87 05/20/2021   PLT 209 05/20/2021   No results found for: "IRON", "TIBC", "FERRITIN"  Attestation Statements:   Reviewed by clinician on day of visit: allergies, medications, problem list, medical history, surgical  history, family history, social history, and previous encounter notes.   I, Burt Knack, am acting as transcriptionist for Quillian Quince, MD.  I have reviewed the above documentation for accuracy and completeness, and I agree with the above. -  Quillian Quince, MD

## 2022-05-11 ENCOUNTER — Ambulatory Visit (INDEPENDENT_AMBULATORY_CARE_PROVIDER_SITE_OTHER): Payer: 59 | Admitting: Physician Assistant

## 2022-05-11 ENCOUNTER — Encounter (INDEPENDENT_AMBULATORY_CARE_PROVIDER_SITE_OTHER): Payer: Self-pay | Admitting: Physician Assistant

## 2022-05-11 VITALS — BP 126/83 | HR 79 | Temp 98.6°F | Ht 68.0 in | Wt 215.0 lb

## 2022-05-11 DIAGNOSIS — Z6832 Body mass index (BMI) 32.0-32.9, adult: Secondary | ICD-10-CM | POA: Diagnosis not present

## 2022-05-11 DIAGNOSIS — E559 Vitamin D deficiency, unspecified: Secondary | ICD-10-CM | POA: Diagnosis not present

## 2022-05-11 DIAGNOSIS — E039 Hypothyroidism, unspecified: Secondary | ICD-10-CM

## 2022-05-11 DIAGNOSIS — E669 Obesity, unspecified: Secondary | ICD-10-CM

## 2022-05-11 MED ORDER — WEGOVY 1.7 MG/0.75ML ~~LOC~~ SOAJ: 1.7000 mg | SUBCUTANEOUS | 0 refills | Status: DC

## 2022-05-23 NOTE — Progress Notes (Unsigned)
Chief Complaint:   OBESITY Cathy Vargas is here to discuss her progress with her obesity treatment plan along with follow-up of her obesity related diagnoses. Cathy Vargas is on the Category 3 Plan and states she is following her eating plan approximately 30% of the time. Cathy Vargas states she is exercising 0 minutes 0 times per week.  Today's visit was #: 14 Starting weight: 249 lbs Starting date: 10/13/2021 Today's weight: 215 lbs Today's date: 05/11/2022 Total lbs lost to date: 34 lbs Total lbs lost since last in-office visit: 2  Interim History: Cathy Vargas has done very well with weight loss.   She reports good control of appetite and hunger.  She is on Wegovy 1.7 mg weekly.  She denies any side effects.   She would like to start incorporating more activity and exercise and we discussed trying some chair yoga and planking activities 2-3 times weekly.   We also discussed starting to use a pedaler while at work at her desk.  Subjective:   1. Vitamin D deficiency Cathy Vargas's Vit D level of 80.8 slightly above goal on 02/18/22. Taking ergocalciferol every other week.  Denies any side effects.  No refills needed.  2. Acquired hypothyroidism Cathy Vargas's TSH of 1.930 on 02/18/22- at goal. Taking Synthroid 150 mcg daily--Denies any side effects.  Assessment/Plan:   1. Vitamin D deficiency Continue ergocalciferol every other week.  Plan to recheck Vit D level next 2 months to avoid over supplementation.  2. Acquired hypothyroidism PCP wants T4 free level with next labs over the next couple of months.  Continue Synthroid 150 mcg daily.  3. Obesity, Current BMI 32.7 Continue/Refill Wegovy 1.7 mg SQ once weekly for 1 month with 0 refills.  -Refill Semaglutide-Weight Management (WEGOVY) 1.7 MG/0.75ML SOAJ; Inject 1.7 mg into the skin once a week.  Dispense: 3 mL; Refill: 0  Cathy Vargas is currently in the action stage of change. As such, her goal is to continue with weight loss efforts. She has agreed to the Category 3 Plan.    Exercise goals: All adults should avoid inactivity. Some physical activity is better than none, and adults who participate in any amount of physical activity gain some health benefits.  Add chair Yoga and planks-15 sec X 10 -2-3 times a week.  Behavioral modification strategies: increasing lean protein intake, decreasing simple carbohydrates, and meal planning and cooking strategies.  Cathy Vargas has agreed to follow-up with our clinic in 3 weeks. She was informed of the importance of frequent follow-up visits to maximize her success with intensive lifestyle modifications for her multiple health conditions.   Objective:   Blood pressure 126/83, pulse 79, temperature 98.6 F (37 C), height 5\' 8"  (1.727 m), weight 215 lb (97.5 kg), last menstrual period 03/03/2022, SpO2 99 %. Body mass index is 32.69 kg/m.  General: Cooperative, alert, well developed, in no acute distress. HEENT: Conjunctivae and lids unremarkable. Cardiovascular: Regular rhythm.  Lungs: Normal work of breathing. Neurologic: No focal deficits.   Lab Results  Component Value Date   CREATININE 0.83 02/18/2022   BUN 8 02/18/2022   NA 138 02/18/2022   K 4.1 02/18/2022   CL 102 02/18/2022   CO2 19 (L) 02/18/2022   Lab Results  Component Value Date   ALT 18 02/18/2022   AST 15 02/18/2022   ALKPHOS 63 02/18/2022   BILITOT 0.2 02/18/2022   Lab Results  Component Value Date   HGBA1C 5.3 02/18/2022   HGBA1C 5.3 05/20/2021   HGBA1C 5.4 05/29/2018   HGBA1C  5.5 01/18/2018   Lab Results  Component Value Date   INSULIN 13.5 02/18/2022   INSULIN 15.5 09/21/2021   INSULIN 27.7 (H) 05/20/2021   INSULIN 46.3 (H) 05/29/2018   INSULIN 22.0 01/18/2018   Lab Results  Component Value Date   TSH 1.930 02/18/2022   Lab Results  Component Value Date   CHOL 176 02/18/2022   HDL 42 02/18/2022   LDLCALC 107 (H) 02/18/2022   TRIG 153 (H) 02/18/2022   CHOLHDL 4.3 05/20/2021   Lab Results  Component Value Date   VD25OH  80.8 02/18/2022   VD25OH 69.1 09/21/2021   VD25OH 38.8 05/20/2021   Lab Results  Component Value Date   WBC 5.1 05/20/2021   HGB 13.9 05/20/2021   HCT 40.3 05/20/2021   MCV 87 05/20/2021   PLT 209 05/20/2021   No results found for: "IRON", "TIBC", "FERRITIN"  Attestation Statements:   Reviewed by clinician on day of visit: allergies, medications, problem list, medical history, surgical history, family history, social history, and previous encounter notes.  I, Brendell Tyus, am acting as transcriptionist for [employer], PA.  I have reviewed the above documentation for accuracy and completeness, and I agree with the above. -  Kadin Canipe,PA-C

## 2022-05-24 ENCOUNTER — Encounter (INDEPENDENT_AMBULATORY_CARE_PROVIDER_SITE_OTHER): Payer: Self-pay | Admitting: Family Medicine

## 2022-05-25 MED ORDER — WEGOVY 1.7 MG/0.75ML ~~LOC~~ SOAJ: 1.7000 mg | SUBCUTANEOUS | 0 refills | Status: DC

## 2022-05-25 NOTE — Telephone Encounter (Signed)
Ok to rf x 1

## 2022-06-02 ENCOUNTER — Encounter (INDEPENDENT_AMBULATORY_CARE_PROVIDER_SITE_OTHER): Payer: Self-pay | Admitting: Family Medicine

## 2022-06-02 ENCOUNTER — Other Ambulatory Visit (HOSPITAL_COMMUNITY): Payer: Self-pay

## 2022-06-02 ENCOUNTER — Ambulatory Visit (INDEPENDENT_AMBULATORY_CARE_PROVIDER_SITE_OTHER): Payer: 59 | Admitting: Family Medicine

## 2022-06-02 VITALS — BP 118/84 | HR 109 | Temp 98.1°F | Ht 68.0 in | Wt 211.0 lb

## 2022-06-02 DIAGNOSIS — E669 Obesity, unspecified: Secondary | ICD-10-CM | POA: Insufficient documentation

## 2022-06-02 DIAGNOSIS — Z6832 Body mass index (BMI) 32.0-32.9, adult: Secondary | ICD-10-CM | POA: Diagnosis not present

## 2022-06-02 DIAGNOSIS — E559 Vitamin D deficiency, unspecified: Secondary | ICD-10-CM | POA: Diagnosis not present

## 2022-06-02 MED ORDER — WEGOVY 1.7 MG/0.75ML ~~LOC~~ SOAJ
1.7000 mg | SUBCUTANEOUS | 0 refills | Status: DC
  Filled 2022-06-02 (×3): qty 3, 28d supply, fill #0

## 2022-06-03 ENCOUNTER — Other Ambulatory Visit (HOSPITAL_COMMUNITY): Payer: Self-pay

## 2022-06-03 ENCOUNTER — Other Ambulatory Visit (HOSPITAL_BASED_OUTPATIENT_CLINIC_OR_DEPARTMENT_OTHER): Payer: Self-pay

## 2022-06-04 ENCOUNTER — Other Ambulatory Visit (HOSPITAL_COMMUNITY): Payer: Self-pay

## 2022-06-16 NOTE — Progress Notes (Signed)
Chief Complaint:   OBESITY Cathy Vargas is here to discuss her progress with her obesity treatment plan along with follow-up of her obesity related diagnoses. Cathy Vargas is on the Category 3 Plan and states she is following her eating plan approximately 50% of the time. Cathy Vargas states she is using the pedal bike for 30 minutes 2 times per week.  Today's visit was #: 15 Starting weight: 249 lbs Starting date: 10/13/2021 Today's weight: 211 lbs Today's date: 06/02/2022 Total lbs lost to date: 38 Total lbs lost since last in-office visit: 4  Interim History: Cathy Vargas has done very well with weight loss. She is using an under desk peddler and she is considering increasing exercise. She is doing well on Wegovy and her hunger is mostly controled. She sometimes skips lunch. She notes needing to drink more water.   Subjective:   1. Vitamin D deficiency Cathy Vargas is on Vitamin D OTC 1,000 IU daily and 50,000 IU prescription weekly. No side effects were noted. She is working on her exercise to help prevent osteoporosis as well as other health benefits.   Assessment/Plan:   1. Vitamin D deficiency Cathy Vargas will continue Vitamin D as is, and we will recheck labs in 1 month.   2. Obesity (BMI 30-39.9)  3. Obesity, Beginning BMI 37.71 We will refill Wegovy for 1 month.   - Semaglutide-Weight Management (WEGOVY) 1.7 MG/0.75ML SOAJ; Inject 1.7 mg into the skin once a week.  Dispense: 3 mL; Refill: 0  Cathy Vargas is currently in the action stage of change. As such, her goal is to continue with weight loss efforts. She has agreed to the Category 3 Plan.   She was educated on ideal levels of exercise. She will work on slowly increasing this into her routine.   Exercise goals: For substantial health benefits, adults should do at least 150 minutes (2 hours and 30 minutes) a week of moderate-intensity, or 75 minutes (1 hour and 15 minutes) a week of vigorous-intensity aerobic physical activity, or an equivalent combination of  moderate- and vigorous-intensity aerobic activity. Aerobic activity should be performed in episodes of at least 10 minutes, and preferably, it should be spread throughout the week.  Behavioral modification strategies: increasing lean protein intake.  Cathy Vargas has agreed to follow-up with our clinic in 4 weeks. She was informed of the importance of frequent follow-up visits to maximize her success with intensive lifestyle modifications for her multiple health conditions.   Objective:   Blood pressure 118/84, pulse (!) 109, temperature 98.1 F (36.7 C), height 5\' 8"  (1.727 m), weight 211 lb (95.7 kg), SpO2 96 %. Body mass index is 32.08 kg/m.  General: Cooperative, alert, well developed, in no acute distress. HEENT: Conjunctivae and lids unremarkable. Cardiovascular: Regular rhythm.  Lungs: Normal work of breathing. Neurologic: No focal deficits.   Lab Results  Component Value Date   CREATININE 0.83 02/18/2022   BUN 8 02/18/2022   NA 138 02/18/2022   K 4.1 02/18/2022   CL 102 02/18/2022   CO2 19 (L) 02/18/2022   Lab Results  Component Value Date   ALT 18 02/18/2022   AST 15 02/18/2022   ALKPHOS 63 02/18/2022   BILITOT 0.2 02/18/2022   Lab Results  Component Value Date   HGBA1C 5.3 02/18/2022   HGBA1C 5.3 05/20/2021   HGBA1C 5.4 05/29/2018   HGBA1C 5.5 01/18/2018   Lab Results  Component Value Date   INSULIN 13.5 02/18/2022   INSULIN 15.5 09/21/2021   INSULIN 27.7 (H) 05/20/2021  INSULIN 46.3 (H) 05/29/2018   INSULIN 22.0 01/18/2018   Lab Results  Component Value Date   TSH 1.930 02/18/2022   Lab Results  Component Value Date   CHOL 176 02/18/2022   HDL 42 02/18/2022   LDLCALC 107 (H) 02/18/2022   TRIG 153 (H) 02/18/2022   CHOLHDL 4.3 05/20/2021   Lab Results  Component Value Date   VD25OH 80.8 02/18/2022   VD25OH 69.1 09/21/2021   VD25OH 38.8 05/20/2021   Lab Results  Component Value Date   WBC 5.1 05/20/2021   HGB 13.9 05/20/2021   HCT 40.3  05/20/2021   MCV 87 05/20/2021   PLT 209 05/20/2021   No results found for: "IRON", "TIBC", "FERRITIN"  Attestation Statements:   Reviewed by clinician on day of visit: allergies, medications, problem list, medical history, surgical history, family history, social history, and previous encounter notes.   I, Trixie Dredge, am acting as transcriptionist for Dennard Nip, MD.  I have reviewed the above documentation for accuracy and completeness, and I agree with the above. -  Dennard Nip, MD

## 2022-07-01 ENCOUNTER — Encounter (INDEPENDENT_AMBULATORY_CARE_PROVIDER_SITE_OTHER): Payer: Self-pay | Admitting: Family Medicine

## 2022-07-01 ENCOUNTER — Ambulatory Visit (INDEPENDENT_AMBULATORY_CARE_PROVIDER_SITE_OTHER): Payer: 59 | Admitting: Family Medicine

## 2022-07-01 ENCOUNTER — Other Ambulatory Visit (HOSPITAL_COMMUNITY): Payer: Self-pay

## 2022-07-01 VITALS — BP 131/78 | HR 90 | Temp 97.8°F | Ht 68.0 in | Wt 211.0 lb

## 2022-07-01 DIAGNOSIS — E669 Obesity, unspecified: Secondary | ICD-10-CM | POA: Diagnosis not present

## 2022-07-01 DIAGNOSIS — E559 Vitamin D deficiency, unspecified: Secondary | ICD-10-CM

## 2022-07-01 DIAGNOSIS — Z6832 Body mass index (BMI) 32.0-32.9, adult: Secondary | ICD-10-CM

## 2022-07-01 DIAGNOSIS — E88819 Insulin resistance, unspecified: Secondary | ICD-10-CM | POA: Diagnosis not present

## 2022-07-01 DIAGNOSIS — Z6831 Body mass index (BMI) 31.0-31.9, adult: Secondary | ICD-10-CM

## 2022-07-01 HISTORY — DX: Body mass index (BMI) 31.0-31.9, adult: Z68.31

## 2022-07-01 MED ORDER — VITAMIN D (ERGOCALCIFEROL) 1.25 MG (50000 UNIT) PO CAPS: 50000.0000 [IU] | ORAL_CAPSULE | ORAL | 0 refills | Status: DC

## 2022-07-01 MED ORDER — WEGOVY 1.7 MG/0.75ML ~~LOC~~ SOAJ
1.7000 mg | SUBCUTANEOUS | 0 refills | Status: DC
  Filled 2022-07-01: qty 3, 28d supply, fill #0

## 2022-07-13 NOTE — Progress Notes (Signed)
Chief Complaint:   OBESITY Cathy Vargas is here to discuss her progress with her obesity treatment plan along with follow-up of her obesity related diagnoses. Cathy Vargas is on the Category 3 Plan and states she is following her eating plan approximately 40% of the time. [name] states she is using the pedal bike for 20-30 minutes 4 times per week.  Today's visit was #: 17 Starting weight: 249 lbs Starting date: 05/20/2021 Today's weight: 211 lbs Today's date: 07/01/2022 Total lbs lost to date: 38 Total lbs lost since last in-office visit: 0  Interim History: [name] continues to work on her diet and weight loss.  She is doing well on Wegovy overall with no side effects noted.  Subjective:   1. Vitamin D deficiency Cathy Vargas is stable on vitamin D, and her last level had improved.  No side effects were noted.  2. Insulin resistance Cathy Vargas notes increased polyphagia, especially when she eats more carbohydrates.  Assessment/Plan:   1. Vitamin D deficiency Cathy Vargas will continue prescription vitamin D, and we will refill for 90 days.  - Vitamin D, Ergocalciferol, (DRISDOL) 1.25 MG (50000 UNIT) CAPS capsule; Take 1 capsule (50,000 Units total) by mouth every 7 (seven) days.  Dispense: 12 capsule; Refill: 0  2. Insulin resistance Cathy Vargas will continue with her diet and decreasing simple carbohydrates.  She was educated on carbohydrates and insulin response, and how this affects her hunger.  3. BMI 32.0-32.9,adult  4. Obesity, Beginning BMI 37.71 We will refill Wegovy 1.7 mg for 1 month.   - Semaglutide-Weight Management (WEGOVY) 1.7 MG/0.75ML SOAJ; Inject 1.7 mg into the skin once a week.  Dispense: 3 mL; Refill: 0  Cathy Vargas is currently in the action stage of change. As such, her goal is to continue with weight loss efforts. She has agreed to the Category 3 Plan.   Exercise goals: As is.   Behavioral modification strategies: increasing lean protein intake.  Cathy Vargas has agreed to follow-up with our clinic in 4  weeks. She was informed of the importance of frequent follow-up visits to maximize her success with intensive lifestyle modifications for her multiple health conditions.   Objective:   Blood pressure 131/78, pulse 90, temperature 97.8 F (36.6 C), height 5\' 8"  (1.727 m), weight 211 lb (95.7 kg), SpO2 98 %. Body mass index is 32.08 kg/m.  Lab Results  Component Value Date   CREATININE 0.83 02/18/2022   BUN 8 02/18/2022   NA 138 02/18/2022   K 4.1 02/18/2022   CL 102 02/18/2022   CO2 19 (L) 02/18/2022   Lab Results  Component Value Date   ALT 18 02/18/2022   AST 15 02/18/2022   ALKPHOS 63 02/18/2022   BILITOT 0.2 02/18/2022   Lab Results  Component Value Date   HGBA1C 5.3 02/18/2022   HGBA1C 5.3 05/20/2021   HGBA1C 5.4 05/29/2018   HGBA1C 5.5 01/18/2018   Lab Results  Component Value Date   INSULIN 13.5 02/18/2022   INSULIN 15.5 09/21/2021   INSULIN 27.7 (H) 05/20/2021   INSULIN 46.3 (H) 05/29/2018   INSULIN 22.0 01/18/2018   Lab Results  Component Value Date   TSH 1.930 02/18/2022   Lab Results  Component Value Date   CHOL 176 02/18/2022   HDL 42 02/18/2022   LDLCALC 107 (H) 02/18/2022   TRIG 153 (H) 02/18/2022   CHOLHDL 4.3 05/20/2021   Lab Results  Component Value Date   VD25OH 80.8 02/18/2022   VD25OH 69.1 09/21/2021   VD25OH 38.8 05/20/2021  Lab Results  Component Value Date   WBC 5.1 05/20/2021   HGB 13.9 05/20/2021   HCT 40.3 05/20/2021   MCV 87 05/20/2021   PLT 209 05/20/2021   No results found for: "IRON", "TIBC", "FERRITIN"  Attestation Statements:   Reviewed by clinician on day of visit: allergies, medications, problem list, medical history, surgical history, family history, social history, and previous encounter notes.   I, Burt Knack, am acting as transcriptionist for Quillian Quince, MD.  I have reviewed the above documentation for accuracy and completeness, and I agree with the above. -  Quillian Quince, MD

## 2022-07-27 ENCOUNTER — Other Ambulatory Visit (HOSPITAL_COMMUNITY): Payer: Self-pay

## 2022-07-27 ENCOUNTER — Encounter (INDEPENDENT_AMBULATORY_CARE_PROVIDER_SITE_OTHER): Payer: Self-pay | Admitting: Family Medicine

## 2022-07-27 ENCOUNTER — Ambulatory Visit (INDEPENDENT_AMBULATORY_CARE_PROVIDER_SITE_OTHER): Payer: 59 | Admitting: Family Medicine

## 2022-07-27 VITALS — BP 132/84 | HR 78 | Temp 98.1°F | Ht 68.0 in | Wt 210.0 lb

## 2022-07-27 DIAGNOSIS — E559 Vitamin D deficiency, unspecified: Secondary | ICD-10-CM

## 2022-07-27 DIAGNOSIS — R632 Polyphagia: Secondary | ICD-10-CM | POA: Diagnosis not present

## 2022-07-27 DIAGNOSIS — Z6831 Body mass index (BMI) 31.0-31.9, adult: Secondary | ICD-10-CM | POA: Diagnosis not present

## 2022-07-27 DIAGNOSIS — E669 Obesity, unspecified: Secondary | ICD-10-CM

## 2022-07-27 MED ORDER — WEGOVY 1.7 MG/0.75ML ~~LOC~~ SOAJ
1.7000 mg | SUBCUTANEOUS | 0 refills | Status: DC
  Filled 2022-07-27: qty 3, 28d supply, fill #0

## 2022-07-28 ENCOUNTER — Other Ambulatory Visit (HOSPITAL_COMMUNITY): Payer: Self-pay

## 2022-07-28 ENCOUNTER — Telehealth (INDEPENDENT_AMBULATORY_CARE_PROVIDER_SITE_OTHER): Payer: Self-pay | Admitting: Family Medicine

## 2022-07-28 ENCOUNTER — Encounter (INDEPENDENT_AMBULATORY_CARE_PROVIDER_SITE_OTHER): Payer: Self-pay | Admitting: Family Medicine

## 2022-07-28 NOTE — Telephone Encounter (Signed)
Pt called 07/28/22  she was returning a call to [facility]. She had left her a msg

## 2022-07-28 NOTE — Telephone Encounter (Signed)
Pt called regarding her refill for [facility] Pt states that her pharmacy has not received the rx at this time. I advised the pt that since her appt was late yesterday afternoon,give it a little more time today. Pt would like an alert once the medication has been sent to her pharmacy.

## 2022-07-28 NOTE — Telephone Encounter (Signed)
Left message for patient to return call.

## 2022-07-28 NOTE — Telephone Encounter (Signed)
Please return patients call, she called back

## 2022-07-28 NOTE — Telephone Encounter (Signed)
Left message to return call regarding insurance information.

## 2022-07-29 ENCOUNTER — Other Ambulatory Visit (HOSPITAL_COMMUNITY): Payer: Self-pay

## 2022-07-29 ENCOUNTER — Telehealth (INDEPENDENT_AMBULATORY_CARE_PROVIDER_SITE_OTHER): Payer: Self-pay | Admitting: *Deleted

## 2022-07-29 ENCOUNTER — Other Ambulatory Visit: Payer: Self-pay

## 2022-07-29 NOTE — Telephone Encounter (Signed)
PA submitted via CoverMymeds. Patient to contact pharmacy, if no response by the time our office closes today.   Cathy Vargas (Key: [id number])  This request has been approved.  Please note any additional information provided by [facility] at the bottom of your screen.

## 2022-07-30 ENCOUNTER — Other Ambulatory Visit (HOSPITAL_COMMUNITY): Payer: Self-pay

## 2022-08-02 NOTE — Progress Notes (Unsigned)
Chief Complaint:   OBESITY Cathy Vargas is here to discuss her progress with her obesity treatment plan along with follow-up of her obesity related diagnoses. Gabrella is on the Category 3 Plan and states she is following her eating plan approximately 60% of the time. Cathy Vargas states she is walking and bike pedaling for 30 minutes 2 times per week.  Today's visit was #: 18 Starting weight: 249 lbs Starting date: 05/20/2021 Today's weight: 210 lbs Today's date: 07/27/2022 Total lbs lost to date: 39 Total lbs lost since last in-office visit: 1  Interim History: Cathy Vargas continues to do well with weight loss, but she sometimes struggles to follow her plan closely.  She is getting bored with her lunch options.  Subjective:   1. Polyphagia Keatyn is doing well on Wegovy.  She is taking a fiber gummy and her constipation is controlled.  She notes feeling more hunger control.  2. Vitamin D deficiency Cathy Vargas continues to take vitamin D prescription, but she is taking it every other week.  Assessment/Plan:   1. Polyphagia Cathy Vargas will continue Wegovy 1.7 mg once weekly, and we will refill for 1 month.  - Semaglutide-Weight Management (WEGOVY) 1.7 MG/0.75ML SOAJ; Inject 1.7 mg into the skin once a week.  Dispense: 3 mL; Refill: 0  2. Vitamin D deficiency [name] will continue prescription vitamin D, and we will recheck labs at her next visit.  3. BMI 31.0-31.9,adult  4. Obesity, Beginning BMI 37.71 - Semaglutide-Weight Management (WEGOVY) 1.7 MG/0.75ML SOAJ; Inject 1.7 mg into the skin once a week.  Dispense: 3 mL; Refill: 0  Cathy Vargas is currently in the action stage of change. As such, her goal is to continue with weight loss efforts. She has agreed to the Category 3 Plan.   Lunch options were discussed.   Exercise goals: As is.   Behavioral modification strategies: increasing lean protein intake.  Cathy Vargas has agreed to follow-up with our clinic in 4 weeks. She was informed of the importance of frequent  follow-up visits to maximize her success with intensive lifestyle modifications for her multiple health conditions.   Objective:   Blood pressure 132/84, pulse 78, temperature 98.1 F (36.7 C), height 5\' 8"  (1.727 m), weight 210 lb (95.3 kg), SpO2 98 %. Body mass index is 31.93 kg/m.  Lab Results  Component Value Date   CREATININE 0.83 02/18/2022   BUN 8 02/18/2022   NA 138 02/18/2022   K 4.1 02/18/2022   CL 102 02/18/2022   CO2 19 (L) 02/18/2022   Lab Results  Component Value Date   ALT 18 02/18/2022   AST 15 02/18/2022   ALKPHOS 63 02/18/2022   BILITOT 0.2 02/18/2022   Lab Results  Component Value Date   HGBA1C 5.3 02/18/2022   HGBA1C 5.3 05/20/2021   HGBA1C 5.4 05/29/2018   HGBA1C 5.5 01/18/2018   Lab Results  Component Value Date   INSULIN 13.5 02/18/2022   INSULIN 15.5 09/21/2021   INSULIN 27.7 (H) 05/20/2021   INSULIN 46.3 (H) 05/29/2018   INSULIN 22.0 01/18/2018   Lab Results  Component Value Date   TSH 1.930 02/18/2022   Lab Results  Component Value Date   CHOL 176 02/18/2022   HDL 42 02/18/2022   LDLCALC 107 (H) 02/18/2022   TRIG 153 (H) 02/18/2022   CHOLHDL 4.3 05/20/2021   Lab Results  Component Value Date   VD25OH 80.8 02/18/2022   VD25OH 69.1 09/21/2021   VD25OH 38.8 05/20/2021   Lab Results  Component Value  Date   WBC 5.1 05/20/2021   HGB 13.9 05/20/2021   HCT 40.3 05/20/2021   MCV 87 05/20/2021   PLT 209 05/20/2021   No results found for: "IRON", "TIBC", "FERRITIN"  Attestation Statements:   Reviewed by clinician on day of visit: allergies, medications, problem list, medical history, surgical history, family history, social history, and previous encounter notes.   I, Trixie Dredge, am acting as transcriptionist for Dennard Nip, MD.  I have reviewed the above documentation for accuracy and completeness, and I agree with the above. -  Dennard Nip, MD

## 2022-08-24 ENCOUNTER — Other Ambulatory Visit (HOSPITAL_COMMUNITY): Payer: Self-pay

## 2022-08-24 ENCOUNTER — Encounter (INDEPENDENT_AMBULATORY_CARE_PROVIDER_SITE_OTHER): Payer: Self-pay | Admitting: Family Medicine

## 2022-08-24 ENCOUNTER — Ambulatory Visit (INDEPENDENT_AMBULATORY_CARE_PROVIDER_SITE_OTHER): Payer: 59 | Admitting: Family Medicine

## 2022-08-24 VITALS — BP 130/83 | HR 82 | Temp 97.9°F | Ht 68.0 in | Wt 210.0 lb

## 2022-08-24 DIAGNOSIS — Z6831 Body mass index (BMI) 31.0-31.9, adult: Secondary | ICD-10-CM | POA: Diagnosis not present

## 2022-08-24 DIAGNOSIS — E669 Obesity, unspecified: Secondary | ICD-10-CM | POA: Diagnosis not present

## 2022-08-24 DIAGNOSIS — K5909 Other constipation: Secondary | ICD-10-CM | POA: Diagnosis not present

## 2022-08-24 DIAGNOSIS — R632 Polyphagia: Secondary | ICD-10-CM | POA: Diagnosis not present

## 2022-08-24 MED ORDER — WEGOVY 1.7 MG/0.75ML ~~LOC~~ SOAJ
1.7000 mg | SUBCUTANEOUS | 0 refills | Status: DC
  Filled 2022-08-24: qty 3, 28d supply, fill #0

## 2022-08-25 NOTE — Progress Notes (Unsigned)
Chief Complaint:   OBESITY Cathy Vargas is here to discuss her progress with her obesity treatment plan along with follow-up of her obesity related diagnoses. Cathy Vargas is on the Category 3 Plan and states she is following her eating plan approximately 50% of the time. Cathy Vargas states she is walking and using the pedal bike for 30 minutes 1 times per week.  Today's visit was #: 19 Starting weight: 249 lbs Starting date: 05/20/2021 Today's weight: 210 lbs Today's date: 08/24/2022 Total lbs lost to date: 39 Total lbs lost since last in-office visit: 0  Interim History: Cathy Vargas has done well with maintaining her weight loss. She is working on increasing her exercise, but she is struggling with family meal planning.   Subjective:   1. Polyphagia Cathy Vargas is on [place] and she feels it is still helping with her polyphagia. She notes getting full faster.   2. Other constipation Cathy Vargas is having some constipation, and this likely due to [facility].   Assessment/Plan:   1. Polyphagia Liliana will continue Wegovy at 1.7 mg and we will refill for 1 month. She will continue to work on her diet.   - Semaglutide-Weight Management (WEGOVY) 1.7 MG/0.75ML SOAJ; Inject 1.7 mg into the skin once a week.  Dispense: 3 mL; Refill: 0  2. Other constipation Cathy Vargas is to increase her water and vegetable intake. She will start OTC MiraLAX 1/2-1 capful per day. We will continue to follow-up on her progress.   3. BMI 31.0-31.9,adult  4. Obesity, Beginning BMI 37.71 Cathy Vargas is currently in the action stage of change. As such, her goal is to continue with weight loss efforts. She has agreed to the Category 3 Plan.   We discussed ways to plan meals ahead, and discussed a variety of simple recipes that meet her nutritional criteria.   Exercise goals: As is.   Behavioral modification strategies: increasing water intake, increasing high fiber foods, and meal planning and cooking strategies.  Cathy Vargas has agreed to follow-up with our clinic  in 3 to 4 weeks. She was informed of the importance of frequent follow-up visits to maximize her success with intensive lifestyle modifications for her multiple health conditions.   Objective:   Blood pressure 130/83, pulse 82, temperature 97.9 F (36.6 C), height  (1.727 m), weight 210 lb (95.3 kg), SpO2 99 %. Body mass index is 31.93 kg/m.  Lab Results  Component Value Date   CREATININE 0.83 02/18/2022   BUN 8 02/18/2022   NA 138 02/18/2022   K 4.1 02/18/2022   CL 102 02/18/2022   CO2 19 (L) 02/18/2022   Lab Results  Component Value Date   ALT 18 02/18/2022   AST 15 02/18/2022   ALKPHOS 63 02/18/2022   BILITOT 0.2 02/18/2022   Lab Results  Component Value Date   HGBA1C 5.3 02/18/2022   HGBA1C 5.3 05/20/2021   HGBA1C 5.4 05/29/2018   HGBA1C 5.5 01/18/2018   Lab Results  Component Value Date   INSULIN 13.5 02/18/2022   INSULIN 15.5 09/21/2021   INSULIN 27.7 (H) 05/20/2021   INSULIN 46.3 (H) 05/29/2018   INSULIN 22.0 01/18/2018   Lab Results  Component Value Date   TSH 1.930 02/18/2022   Lab Results  Component Value Date   CHOL 176 02/18/2022   HDL 42 02/18/2022   LDLCALC 107 (H) 02/18/2022   TRIG 153 (H) 02/18/2022   CHOLHDL 4.3 05/20/2021   Lab Results  Component Value Date   VD25OH 80.8 02/18/2022   VD25OH  69.1 09/21/2021   VD25OH 38.8 05/20/2021   Lab Results  Component Value Date   WBC 5.1 05/20/2021   HGB 13.9 05/20/2021   HCT 40.3 05/20/2021   MCV 87 05/20/2021   PLT 209 05/20/2021   No results found for: "IRON", "TIBC", "FERRITIN"  Attestation Statements:   Reviewed by clinician on day of visit: allergies, medications, problem list, medical history, surgical history, family history, social history, and previous encounter notes.  Time spent on visit including pre-visit chart review and post-visit care and charting was 40 minutes.   I, Burt Knack, am acting as transcriptionist for Cathy Quince, MD.  I have reviewed the above  documentation for accuracy and completeness, and I agree with the above. -  Cathy Quince, MD

## 2022-09-02 NOTE — Progress Notes (Signed)
Cathy Vargas D.[facility] [address] [place] [address] Phone: [phone]   Assessment and Plan:     1. Chronic pain of right knee -Chronic with exacerbation, initial sports medicine visit - Consistent with intra-articular knee pain based on HPI, physical exam - X-ray obtained in clinic.  My interpretation: No acute fracture or dislocation.  Overall unremarkable imaging - Start meloxicam 15 mg daily x2 weeks.  If still having pain after 2 weeks, complete 3rd-week of meloxicam. May use remaining meloxicam as needed once daily for pain control.  Do not to use additional NSAIDs while taking meloxicam.  May use Tylenol [phone] mg 2 to 3 times a day for breakthrough pain. -Start HEP for knee - DG Knee AP/LAT W/Sunrise Right; Future  Other orders - meloxicam (MOBIC) 15 MG tablet; Take 1 tablet (15 mg total) by mouth daily.    Pertinent previous records reviewed include none   Follow Up: 4 weeks for reevaluation.  If no improvement or worsening of symptoms, could consider physical therapy versus CSI   Subjective:   I, Cathy Vargas, am serving as a Neurosurgeon for Doctor Richardean Sale  Chief Complaint: right knee pain   HPI:   09/03/2022 Patient is a 37 year old female complaining of right knee pain. Patient states 6 months of pain after activity, last 2 month pain is constant, isnt able to sleep through the night, after activity 6-7/10 pain 2/10 at rest, posterior knee pain, no radiating pain, no numbness or tingling, popping relieves the pain sometimes, no meds for the pain but ibu when its bad, no pain when going up and down steps , pain with flexion and bending to get something from low, ha tried massage gun and that doesn't any help, no MOI, hx of achilles tendonitis   Relevant Historical Information: Hypothyroidism  Additional pertinent review of systems negative.   Current Outpatient Medications:    cholecalciferol (VITAMIN D3) 25  MCG (1000 UNIT) tablet, Take 1,000 Units by mouth daily., Disp: , Rfl:    levothyroxine (SYNTHROID) 150 MCG tablet, Take 1 tablet (150 mcg total) by mouth daily before breakfast., Disp: 90 tablet, Rfl: 3   loratadine (CLARITIN) 10 MG tablet, Take 10 mg by mouth daily., Disp: , Rfl:    meloxicam (MOBIC) 15 MG tablet, Take 1 tablet (15 mg total) by mouth daily., Disp: 30 tablet, Rfl: 0   Norethindrone-Ethinyl Estradiol-Fe Biphas (LO LOESTRIN FE) 1 MG-10 MCG / 10 MCG tablet, Take 1 tablet by mouth daily., Disp: , Rfl:    Semaglutide-Weight Management (WEGOVY) 1.7 MG/0.75ML SOAJ, Inject 1.7 mg into the skin once a week., Disp: 3 mL, Rfl: 0   Vitamin D, Ergocalciferol, (DRISDOL) 1.25 MG (50000 UNIT) CAPS capsule, Take 1 capsule (50,000 Units total) by mouth every 7 (seven) days., Disp: 12 capsule, Rfl: 0   Objective:     Vitals:   09/03/22 1252  BP: 120/80  Pulse: 88  SpO2: 98%  Weight: 215 lb (97.5 kg)  Height: 5\' 8"  (1.727 m)      Body mass index is 32.69 kg/m.    Physical Exam:    General:  awake, alert oriented, no acute distress nontoxic Skin: no suspicious lesions or rashes Neuro:sensation intact and strength 5/5 with no deficits, no atrophy, normal muscle tone Psych: No signs of anxiety, depression or other mood disorder  Right knee: No swelling No deformity Neg fluid wave, joint milking ROM Flex 110, Ext 0 NTTP over the quad  tendon, medial fem condyle, lat fem condyle, patella, plica, patella tendon, tibial tuberostiy, fibular head, posterior fossa, pes anserine bursa, gerdy's tubercle, medial jt line, lateral jt line Neg anterior and posterior drawer Neg lachman Neg sag sign Negative varus stress Negative valgus stress Negative McMurray Negative Thessaly, though posterior knee pain with single-leg squat  Gait normal    Electronically signed by:  Cathy Vargas D.[facility] 1:18 PM 09/03/22

## 2022-09-03 ENCOUNTER — Other Ambulatory Visit (HOSPITAL_COMMUNITY): Payer: Self-pay

## 2022-09-03 ENCOUNTER — Ambulatory Visit (INDEPENDENT_AMBULATORY_CARE_PROVIDER_SITE_OTHER): Payer: 59 | Admitting: Sports Medicine

## 2022-09-03 ENCOUNTER — Ambulatory Visit (INDEPENDENT_AMBULATORY_CARE_PROVIDER_SITE_OTHER): Payer: 59

## 2022-09-03 VITALS — BP 120/80 | HR 88 | Ht 68.0 in | Wt 215.0 lb

## 2022-09-03 DIAGNOSIS — G8929 Other chronic pain: Secondary | ICD-10-CM

## 2022-09-03 DIAGNOSIS — M25561 Pain in right knee: Secondary | ICD-10-CM | POA: Diagnosis not present

## 2022-09-03 MED ORDER — MELOXICAM 15 MG PO TABS
15.0000 mg | ORAL_TABLET | Freq: Every day | ORAL | 0 refills | Status: DC
  Filled 2022-09-03: qty 30, 30d supply, fill #0

## 2022-09-03 NOTE — Patient Instructions (Addendum)
Good to see you  Knee HEP  - Start meloxicam 15 mg daily x2 weeks.  If still having pain after 2 weeks, complete 3rd-week of meloxicam. May use remaining meloxicam as needed once daily for pain control.  Do not to use additional NSAIDs while taking meloxicam.  May use Tylenol [phone] mg 2 to 3 times a day for breakthrough pain. 4 week follow up

## 2022-09-23 ENCOUNTER — Ambulatory Visit (INDEPENDENT_AMBULATORY_CARE_PROVIDER_SITE_OTHER): Payer: 59 | Admitting: Family Medicine

## 2022-09-23 ENCOUNTER — Encounter (INDEPENDENT_AMBULATORY_CARE_PROVIDER_SITE_OTHER): Payer: Self-pay | Admitting: Family Medicine

## 2022-09-23 ENCOUNTER — Other Ambulatory Visit (HOSPITAL_COMMUNITY): Payer: Self-pay

## 2022-09-23 VITALS — BP 139/92 | HR 80 | Temp 98.5°F | Ht 68.0 in | Wt 209.0 lb

## 2022-09-23 DIAGNOSIS — E559 Vitamin D deficiency, unspecified: Secondary | ICD-10-CM

## 2022-09-23 DIAGNOSIS — E669 Obesity, unspecified: Secondary | ICD-10-CM

## 2022-09-23 DIAGNOSIS — R4689 Other symptoms and signs involving appearance and behavior: Secondary | ICD-10-CM

## 2022-09-23 DIAGNOSIS — R632 Polyphagia: Secondary | ICD-10-CM

## 2022-09-23 DIAGNOSIS — F32A Depression, unspecified: Secondary | ICD-10-CM | POA: Insufficient documentation

## 2022-09-23 DIAGNOSIS — F3289 Other specified depressive episodes: Secondary | ICD-10-CM

## 2022-09-23 DIAGNOSIS — R030 Elevated blood-pressure reading, without diagnosis of hypertension: Secondary | ICD-10-CM

## 2022-09-23 DIAGNOSIS — Z6831 Body mass index (BMI) 31.0-31.9, adult: Secondary | ICD-10-CM

## 2022-09-23 MED ORDER — WEGOVY 1.7 MG/0.75ML ~~LOC~~ SOAJ
1.7000 mg | SUBCUTANEOUS | 0 refills | Status: DC
  Filled 2022-09-23: qty 3, 28d supply, fill #0

## 2022-09-23 MED ORDER — BUPROPION HCL ER (SR) 150 MG PO TB12
150.0000 mg | ORAL_TABLET | Freq: Every day | ORAL | 0 refills | Status: DC
Start: 2022-09-23 — End: 2022-10-19
  Filled 2022-09-23: qty 30, 30d supply, fill #0

## 2022-09-27 NOTE — Progress Notes (Unsigned)
Chief Complaint:   OBESITY Cathy Vargas is here to discuss her progress with her obesity treatment plan along with follow-up of her obesity related diagnoses. [name] is on the Category 3 Plan and states she is following her eating plan approximately 70% of the time. [name] states she is walking and using the peddle bike for 30 minutes 1 time per week.  Today's visit was #: 20 Starting weight: 249 lbs Starting date: 05/20/2021 Today's weight: 209 lbs Today's date: 09/23/2022 Total lbs lost to date: 40 Total lbs lost since last in-office visit: 1  Interim History: Cathy Vargas continues to do well with her weight loss despite traveling, and some celebration eating for Mother's Day.  She is working on eating more at home and less eating out.  Subjective:   1. Vitamin D deficiency Cathy Vargas is on a OTC vitamin D 1,000 units daily.  Her last level was at goal.  2. Polyphagia Cathy Vargas notes polyphagia has decreased.  She continues to work on her diet and increase her protein.  3. Elevated blood pressure reading without diagnosis of hypertension [name]'s blood pressure is normally at goal, but it is mildly elevated today.  She denies chest pain or headache.  4. Emotional Eating Behavior Cathy Vargas is struggling with some increase in stress and comfort eating, worse in the second half of the day.  She especially wants sugary foods.  Assessment/Plan:   1. Vitamin D deficiency Cathy Vargas is at low risk of over replacement on her current dose of vitamin D.  She will continue vitamin D at 1,000 IU daily OTC and we will recheck labs in 1 month.  2. Polyphagia Cathy Vargas will continue Wegovy 1.7 mg once weekly, and we will refill for 1 month.  - Semaglutide-Weight Management (WEGOVY) 1.7 MG/0.75ML SOAJ; Inject 1.7 mg into the skin once a week.  Dispense: 3 mL; Refill: 0  3. Elevated blood pressure reading without diagnosis of hypertension Cathy Vargas will continue to work on her diet and exercise, and we will recheck her blood pressure in  1 month.  4. Emotional Eating Behavior Cathy Vargas agreed to start Wellbutrin SR at 150 mg every morning with no refills.  We will follow-up at her next visit.  - buPROPion (WELLBUTRIN SR) 150 MG 12 hr tablet; Take 1 tablet (150 mg total) by mouth daily.  Dispense: 30 tablet; Refill: 0  5. BMI 31.0-31.9,adult  6. Obesity, Beginning BMI 37.71 Cathy Vargas is currently in the action stage of change. As such, her goal is to continue with weight loss efforts. She has agreed to the Category 3 Plan.   Exercise goals: As is.   Behavioral modification strategies: no skipping meals, meal planning and cooking strategies, and emotional eating strategies.  Cathy Vargas has agreed to follow-up with our clinic in 4 weeks. She was informed of the importance of frequent follow-up visits to maximize her success with intensive lifestyle modifications for her multiple health conditions.   Objective:   Blood pressure (!) 139/92, pulse 80, temperature 98.5 F (36.9 C), height 5\' 8"  (1.727 m), weight 209 lb (94.8 kg), SpO2 100 %. Body mass index is 31.78 kg/m.  Lab Results  Component Value Date   CREATININE 0.83 02/18/2022   BUN 8 02/18/2022   NA 138 02/18/2022   K 4.1 02/18/2022   CL 102 02/18/2022   CO2 19 (L) 02/18/2022   Lab Results  Component Value Date   ALT 18 02/18/2022   AST 15 02/18/2022   ALKPHOS 63 02/18/2022   BILITOT 0.2  02/18/2022   Lab Results  Component Value Date   HGBA1C 5.3 02/18/2022   HGBA1C 5.3 05/20/2021   HGBA1C 5.4 05/29/2018   HGBA1C 5.5 01/18/2018   Lab Results  Component Value Date   INSULIN 13.5 02/18/2022   INSULIN 15.5 09/21/2021   INSULIN 27.7 (H) 05/20/2021   INSULIN 46.3 (H) 05/29/2018   INSULIN 22.0 01/18/2018   Lab Results  Component Value Date   TSH 1.930 02/18/2022   Lab Results  Component Value Date   CHOL 176 02/18/2022   HDL 42 02/18/2022   LDLCALC 107 (H) 02/18/2022   TRIG 153 (H) 02/18/2022   CHOLHDL 4.3 05/20/2021   Lab Results  Component Value  Date   VD25OH 80.8 02/18/2022   VD25OH 69.1 09/21/2021   VD25OH 38.8 05/20/2021   Lab Results  Component Value Date   WBC 5.1 05/20/2021   HGB 13.9 05/20/2021   HCT 40.3 05/20/2021   MCV 87 05/20/2021   PLT 209 05/20/2021   No results found for: "IRON", "TIBC", "FERRITIN"  Attestation Statements:   Reviewed by clinician on day of visit: allergies, medications, problem list, medical history, surgical history, family history, social history, and previous encounter notes.   I, Burt Knack, am acting as transcriptionist for Quillian Quince, MD.  I have reviewed the above documentation for accuracy and completeness, and I agree with the above. -  Quillian Quince, MD

## 2022-10-05 NOTE — Progress Notes (Deleted)
    Cathy Vargas D.[facility] [address] [place] [address] Phone: [phone]   Assessment and Plan:     There are no diagnoses linked to this encounter.  ***   Pertinent previous records reviewed include ***   Follow Up: ***     Subjective:   I, Cathy Vargas, am serving as a Neurosurgeon for Doctor Richardean Sale   Chief Complaint: right knee pain    HPI:    09/03/2022 Patient is a 37 year old female complaining of right knee pain. Patient states 6 months of pain after activity, last 2 month pain is constant, isnt able to sleep through the night, after activity 6-7/10 pain 2/10 at rest, posterior knee pain, no radiating pain, no numbness or tingling, popping relieves the pain sometimes, no meds for the pain but ibu when its bad, no pain when going up and down steps , pain with flexion and bending to get something from low, ha tried massage gun and that doesn't any help, no MOI, hx of achilles tendonitis   10/12/2022 Patient states    Relevant Historical Information: Hypothyroidism  Additional pertinent review of systems negative.   Current Outpatient Medications:    buPROPion (WELLBUTRIN SR) 150 MG 12 hr tablet, Take 1 tablet (150 mg total) by mouth daily., Disp: 30 tablet, Rfl: 0   cholecalciferol (VITAMIN D3) 25 MCG (1000 UNIT) tablet, Take 1,000 Units by mouth daily., Disp: , Rfl:    levothyroxine (SYNTHROID) 150 MCG tablet, Take 1 tablet (150 mcg total) by mouth daily before breakfast., Disp: 90 tablet, Rfl: 3   loratadine (CLARITIN) 10 MG tablet, Take 10 mg by mouth daily., Disp: , Rfl:    meloxicam (MOBIC) 15 MG tablet, Take 1 tablet (15 mg total) by mouth daily., Disp: 30 tablet, Rfl: 0   Norethindrone-Ethinyl Estradiol-Fe Biphas (LO LOESTRIN FE) 1 MG-10 MCG / 10 MCG tablet, Take 1 tablet by mouth daily., Disp: , Rfl:    Semaglutide-Weight Management (WEGOVY) 1.7 MG/0.75ML SOAJ, Inject 1.7 mg into the skin once a week., Disp: 3  mL, Rfl: 0   Vitamin D, Ergocalciferol, (DRISDOL) 1.25 MG (50000 UNIT) CAPS capsule, Take 1 capsule (50,000 Units total) by mouth every 7 (seven) days., Disp: 12 capsule, Rfl: 0   Objective:     There were no vitals filed for this visit.    There is no height or weight on file to calculate BMI.    Physical Exam:    ***   Electronically signed by:  Cathy Vargas [facility] 12:57 PM 10/05/22

## 2022-10-08 NOTE — Progress Notes (Deleted)
    Aleen Sells D.[facility] [address] [place] [address] Phone: [phone]   Assessment and Plan:     There are no diagnoses linked to this encounter.  ***   Pertinent previous records reviewed include ***   Follow Up: ***     Subjective:   I, Cathy Vargas, am serving as a Neurosurgeon for Doctor Richardean Sale   Chief Complaint: right knee pain    HPI:    09/03/2022 Patient is a 37 year old female complaining of right knee pain. Patient states 6 months of pain after activity, last 2 month pain is constant, isnt able to sleep through the night, after activity 6-7/10 pain 2/10 at rest, posterior knee pain, no radiating pain, no numbness or tingling, popping relieves the pain sometimes, no meds for the pain but ibu when its bad, no pain when going up and down steps , pain with flexion and bending to get something from low, ha tried massage gun and that doesn't any help, no MOI, hx of achilles tendonitis   10/15/2022 Patient states    Relevant Historical Information: Hypothyroidism  Additional pertinent review of systems negative.   Current Outpatient Medications:    buPROPion (WELLBUTRIN SR) 150 MG 12 hr tablet, Take 1 tablet (150 mg total) by mouth daily., Disp: 30 tablet, Rfl: 0   cholecalciferol (VITAMIN D3) 25 MCG (1000 UNIT) tablet, Take 1,000 Units by mouth daily., Disp: , Rfl:    levothyroxine (SYNTHROID) 150 MCG tablet, Take 1 tablet (150 mcg total) by mouth daily before breakfast., Disp: 90 tablet, Rfl: 3   loratadine (CLARITIN) 10 MG tablet, Take 10 mg by mouth daily., Disp: , Rfl:    meloxicam (MOBIC) 15 MG tablet, Take 1 tablet (15 mg total) by mouth daily., Disp: 30 tablet, Rfl: 0   Norethindrone-Ethinyl Estradiol-Fe Biphas (LO LOESTRIN FE) 1 MG-10 MCG / 10 MCG tablet, Take 1 tablet by mouth daily., Disp: , Rfl:    Semaglutide-Weight Management (WEGOVY) 1.7 MG/0.75ML SOAJ, Inject 1.7 mg into the skin once a week., Disp: 3  mL, Rfl: 0   Vitamin D, Ergocalciferol, (DRISDOL) 1.25 MG (50000 UNIT) CAPS capsule, Take 1 capsule (50,000 Units total) by mouth every 7 (seven) days., Disp: 12 capsule, Rfl: 0   Objective:     There were no vitals filed for this visit.    There is no height or weight on file to calculate BMI.    Physical Exam:    ***   Electronically signed by:  Aleen Sells D[facility] 7:12 AM 10/08/22

## 2022-10-12 ENCOUNTER — Ambulatory Visit: Payer: 59 | Admitting: Sports Medicine

## 2022-10-15 ENCOUNTER — Ambulatory Visit: Payer: 59 | Admitting: Sports Medicine

## 2022-10-19 ENCOUNTER — Ambulatory Visit (INDEPENDENT_AMBULATORY_CARE_PROVIDER_SITE_OTHER): Payer: 59 | Admitting: Family Medicine

## 2022-10-19 ENCOUNTER — Other Ambulatory Visit (HOSPITAL_COMMUNITY): Payer: Self-pay

## 2022-10-19 ENCOUNTER — Encounter (INDEPENDENT_AMBULATORY_CARE_PROVIDER_SITE_OTHER): Payer: Self-pay | Admitting: Family Medicine

## 2022-10-19 VITALS — BP 131/86 | HR 78 | Temp 97.8°F | Ht 68.0 in | Wt 202.0 lb

## 2022-10-19 DIAGNOSIS — E782 Mixed hyperlipidemia: Secondary | ICD-10-CM

## 2022-10-19 DIAGNOSIS — I10 Essential (primary) hypertension: Secondary | ICD-10-CM | POA: Diagnosis not present

## 2022-10-19 DIAGNOSIS — R632 Polyphagia: Secondary | ICD-10-CM | POA: Diagnosis not present

## 2022-10-19 DIAGNOSIS — F3289 Other specified depressive episodes: Secondary | ICD-10-CM

## 2022-10-19 DIAGNOSIS — Z6830 Body mass index (BMI) 30.0-30.9, adult: Secondary | ICD-10-CM

## 2022-10-19 DIAGNOSIS — E669 Obesity, unspecified: Secondary | ICD-10-CM

## 2022-10-19 DIAGNOSIS — E88819 Insulin resistance, unspecified: Secondary | ICD-10-CM

## 2022-10-19 MED ORDER — WEGOVY 1.7 MG/0.75ML ~~LOC~~ SOAJ
1.7000 mg | SUBCUTANEOUS | 0 refills | Status: DC
Start: 2022-10-19 — End: 2022-11-09
  Filled 2022-10-19: qty 3, 28d supply, fill #0

## 2022-10-19 MED ORDER — BUPROPION HCL ER (SR) 150 MG PO TB12
150.0000 mg | ORAL_TABLET | Freq: Every day | ORAL | 0 refills | Status: DC
Start: 2022-10-19 — End: 2022-11-09
  Filled 2022-10-19: qty 30, 30d supply, fill #0

## 2022-10-20 ENCOUNTER — Other Ambulatory Visit (HOSPITAL_COMMUNITY): Payer: Self-pay

## 2022-10-20 NOTE — Progress Notes (Signed)
Chief Complaint:   OBESITY Cathy Vargas is here to discuss her progress with her obesity treatment plan along with follow-up of her obesity related diagnoses. Cathy Vargas is on the Category 3 Plan and states she is following her eating plan approximately 70% of the time. Cathy Vargas states she is walking and on the pedal bike for 30 minutes 1 time per week.  Today's visit was #: 21 Starting weight: 249 lbs Starting date: 05/20/2021 Today's weight: 202 lbs Today's date: 10/19/2022 Total lbs lost to date: 47 Total lbs lost since last in-office visit: 7  Interim History: Patient has done well with her weight loss in the last month.  Her hunger is controlled and she is doing well with decreasing eating out and with portion control.  She is working on meeting her protein goal.  Subjective:   1. Polyphagia Patient notes polyphagia has improved on Wegovy.  She denies nausea or vomiting.  2. Mixed hyperlipidemia Patient continues to work on decreasing cholesterol in her diet. She is not on statin and she denies chest pain.   3. Insulin resistance Patient is doing well with her diet and exercise.  4. Hypertension, unspecified type Patient's blood pressure has been intermittently elevated, but it is above goal today. She is not on antihypertensives and she wants to continue with diet and exercise. She denies chest pain or headache.   5. Emotional Eating Behavior Patient started Wellbutrin and she felt it helped her with decreasing emotional eating behavior.  Assessment/Plan:   1. Polyphagia Patient will continue Wegovy 1.7 mg, and we will refill for 1 month.  - Semaglutide-Weight Management (WEGOVY) 1.7 MG/0.75ML SOAJ; Inject 1.7 mg into the skin once a week.  Dispense: 3 mL; Refill: 0  2. Mixed hyperlipidemia Patient will continue with her diet and exercise, and we will plan to recheck labs in 2 months.   3. Insulin resistance Patient will continue with her diet and weight loss.  4. Hypertension,  unspecified type Patient will continue with her diet and exercise, and we will recheck her blood pressure in 1 month. May need to start medications if her blood pressure is still above goal.   5. Emotional Eating Behavior Patient will continue Wellbutrin SR 150 mg, and we will refill for 1 month.  - buPROPion (WELLBUTRIN SR) 150 MG 12 hr tablet; Take 1 tablet (150 mg) by mouth daily.  Dispense: 30 tablet; Refill: 0  6. Obesity (BMI 30-39.9)  7. Obesity, Beginning BMI 37.71 Cathy Vargas is currently in the action stage of change. As such, her goal is to continue with weight loss efforts. She has agreed to the Category 3 Plan.   Exercise goals: As is.   Behavioral modification strategies: increasing lean protein intake and meal planning and cooking strategies.  Cathy Vargas has agreed to follow-up with our clinic in 3 weeks. She was informed of the importance of frequent follow-up visits to maximize her success with intensive lifestyle modifications for her multiple health conditions.   Objective:   Blood pressure 131/86, pulse 78, temperature 97.8 F (36.6 C), height 5\' 8"  (1.727 m), weight 202 lb (91.6 kg), SpO2 99 %. Body mass index is 30.71 kg/m.  Lab Results  Component Value Date   CREATININE 0.83 02/18/2022   BUN 8 02/18/2022   NA 138 02/18/2022   K 4.1 02/18/2022   CL 102 02/18/2022   CO2 19 (L) 02/18/2022   Lab Results  Component Value Date   ALT 18 02/18/2022   AST 15 02/18/2022  ALKPHOS 63 02/18/2022   BILITOT 0.2 02/18/2022   Lab Results  Component Value Date   HGBA1C 5.3 02/18/2022   HGBA1C 5.3 05/20/2021   HGBA1C 5.4 05/29/2018   HGBA1C 5.5 01/18/2018   Lab Results  Component Value Date   INSULIN 13.5 02/18/2022   INSULIN 15.5 09/21/2021   INSULIN 27.7 (H) 05/20/2021   INSULIN 46.3 (H) 05/29/2018   INSULIN 22.0 01/18/2018   Lab Results  Component Value Date   TSH 1.930 02/18/2022   Lab Results  Component Value Date   CHOL 176 02/18/2022   HDL 42 02/18/2022    LDLCALC 107 (H) 02/18/2022   TRIG 153 (H) 02/18/2022   CHOLHDL 4.3 05/20/2021   Lab Results  Component Value Date   VD25OH 80.8 02/18/2022   VD25OH 69.1 09/21/2021   VD25OH 38.8 05/20/2021   Lab Results  Component Value Date   WBC 5.1 05/20/2021   HGB 13.9 05/20/2021   HCT 40.3 05/20/2021   MCV 87 05/20/2021   PLT 209 05/20/2021   No results found for: "IRON", "TIBC", "FERRITIN"  Attestation Statements:   Reviewed by clinician on day of visit: allergies, medications, problem list, medical history, surgical history, family history, social history, and previous encounter notes.   I, Burt Knack, am acting as transcriptionist for Quillian Quince, MD.  I have reviewed the above documentation for accuracy and completeness, and I agree with the above. -  Quillian Quince, MD

## 2022-10-21 ENCOUNTER — Ambulatory Visit (INDEPENDENT_AMBULATORY_CARE_PROVIDER_SITE_OTHER): Payer: 59 | Admitting: Family Medicine

## 2022-11-08 ENCOUNTER — Encounter (INDEPENDENT_AMBULATORY_CARE_PROVIDER_SITE_OTHER): Payer: Self-pay | Admitting: Family Medicine

## 2022-11-09 ENCOUNTER — Other Ambulatory Visit (HOSPITAL_COMMUNITY): Payer: Self-pay

## 2022-11-09 ENCOUNTER — Encounter (INDEPENDENT_AMBULATORY_CARE_PROVIDER_SITE_OTHER): Payer: Self-pay | Admitting: Family Medicine

## 2022-11-09 ENCOUNTER — Telehealth (INDEPENDENT_AMBULATORY_CARE_PROVIDER_SITE_OTHER): Payer: Self-pay

## 2022-11-09 ENCOUNTER — Ambulatory Visit (INDEPENDENT_AMBULATORY_CARE_PROVIDER_SITE_OTHER): Payer: 59 | Admitting: Family Medicine

## 2022-11-09 VITALS — BP 134/87 | HR 76 | Temp 98.3°F | Ht 68.0 in | Wt 202.0 lb

## 2022-11-09 DIAGNOSIS — E782 Mixed hyperlipidemia: Secondary | ICD-10-CM

## 2022-11-09 DIAGNOSIS — F3289 Other specified depressive episodes: Secondary | ICD-10-CM

## 2022-11-09 DIAGNOSIS — I158 Other secondary hypertension: Secondary | ICD-10-CM

## 2022-11-09 DIAGNOSIS — R632 Polyphagia: Secondary | ICD-10-CM

## 2022-11-09 DIAGNOSIS — E559 Vitamin D deficiency, unspecified: Secondary | ICD-10-CM | POA: Diagnosis not present

## 2022-11-09 DIAGNOSIS — E669 Obesity, unspecified: Secondary | ICD-10-CM

## 2022-11-09 DIAGNOSIS — Z6830 Body mass index (BMI) 30.0-30.9, adult: Secondary | ICD-10-CM

## 2022-11-09 MED ORDER — BUPROPION HCL ER (SR) 150 MG PO TB12: 150.0000 mg | ORAL_TABLET | Freq: Every day | ORAL | 0 refills | Status: DC

## 2022-11-09 MED ORDER — WEGOVY 1.7 MG/0.75ML ~~LOC~~ SOAJ: 1.7000 mg | SUBCUTANEOUS | 1 refills | Status: DC

## 2022-11-09 MED ORDER — VITAMIN D (ERGOCALCIFEROL) 1.25 MG (50000 UNIT) PO CAPS
50000.0000 [IU] | ORAL_CAPSULE | ORAL | 0 refills | Status: DC
Start: 2022-11-09 — End: 2023-01-19

## 2022-11-09 MED ORDER — WEGOVY 1.7 MG/0.75ML ~~LOC~~ SOAJ
1.7000 mg | SUBCUTANEOUS | 1 refills | Status: DC
Start: 2022-11-09 — End: 2023-01-19
  Filled 2022-11-09 – 2022-11-14 (×2): qty 3, 28d supply, fill #0
  Filled 2022-12-18: qty 3, 28d supply, fill #1

## 2022-11-09 MED ORDER — BUPROPION HCL ER (SR) 150 MG PO TB12
150.0000 mg | ORAL_TABLET | Freq: Every day | ORAL | 0 refills | Status: DC
Start: 2022-11-09 — End: 2022-11-17
  Filled 2022-11-09 – 2022-11-19 (×2): qty 30, 30d supply, fill #0

## 2022-11-09 MED ORDER — VITAMIN D (ERGOCALCIFEROL) 1.25 MG (50000 UNIT) PO CAPS: 50000.0000 [IU] | ORAL_CAPSULE | ORAL | 0 refills | Status: DC

## 2022-11-09 NOTE — Telephone Encounter (Signed)
[employer] is unable to retrieve the clinical questions that must be submitted to initiate the PA request. Please see more information at the bottom of the page for next steps

## 2022-11-09 NOTE — Progress Notes (Unsigned)
Chief Complaint:   OBESITY Cathy Vargas is here to discuss her progress with her obesity treatment plan along with follow-up of her obesity related diagnoses. Cathy Vargas is on the Category 3 Plan and states she is following her eating plan approximately 65% of the time. Cathy Vargas states she is walking and using the pedal bike for 30 minutes 1 time per week.  Today's visit was #: 22 Starting weight: 249 lbs Starting date: 05/20/2021 Today's weight: 202 lbs Today's date: 11/09/2022 Total lbs lost to date: 47 Total lbs lost since last in-office visit: 0  Interim History: Patient has had extra challenges with traveling and family staying with her.  She has used good strategies to avoid weight gain.  Subjective:   1. Vitamin D deficiency Patient's recent vitamin D level was at 80.  2. Polyphagia Patient is on Wegovy and she is doing well with her diet and weight loss.  3. Mixed hyperlipidemia Patient is working on her diet, exercise, and weight loss to control her cholesterol.  4. Other secondary hypertension Patient's blood pressure is improving with diet and exercise.  Her blood pressure is at goal today.  5. Emotional Eating Behavior Patient notes decreased stress and comfort eating on Wellbutrin.  No side effects were noted.  Assessment/Plan:   1. Vitamin D deficiency Patient will continue prescription vitamin D - Vitamin D, Ergocalciferol, (DRISDOL) 1.25 MG (50000 UNIT) CAPS capsule; Take 1 capsule (50,000 Units total) by mouth every 7 (seven) days.  Dispense: 12 capsule; Refill: 0  2. Polyphagia Patient will continue Wegovy 1.7 mg, and we will refill for 2 months.  - Semaglutide-Weight Management (WEGOVY) 1.7 MG/0.75ML SOAJ; Inject 1.7 mg into the skin once a week.  Dispense: 3 mL; Refill: 1  3. Mixed hyperlipidemia Patient will continue with her diet and weight loss.  4. Other secondary hypertension Patient will continue with her diet and weight loss.  5. Emotional Eating  Behavior Patient will continue Wellbutrin SR, and we will refill for 1 month.  - buPROPion (WELLBUTRIN SR) 150 MG 12 hr tablet; Take 1 tablet (150 mg) by mouth daily.  Dispense: 30 tablet; Refill: 0  6. BMI 30.0-30.9,adult  7. Obesity, Beginning BMI 37.71 Cathy Vargas is currently in the action stage of change. As such, her goal is to continue with weight loss efforts. She has agreed to the Category 3 Plan.   Exercise goals: As is.   Behavioral modification strategies: increasing lean protein intake.  [name] has agreed to follow-up with our clinic in 4 weeks. She was informed of the importance of frequent follow-up visits to maximize her success with intensive lifestyle modifications for her multiple health conditions.   Objective:   Blood pressure 134/87, pulse 76, temperature 98.3 F (36.8 C), height 5\' 8"  (1.727 m), weight 202 lb (91.6 kg), SpO2 99 %. Body mass index is 30.71 kg/m.  Lab Results  Component Value Date   CREATININE 0.83 02/18/2022   BUN 8 02/18/2022   NA 138 02/18/2022   K 4.1 02/18/2022   CL 102 02/18/2022   CO2 19 (L) 02/18/2022   Lab Results  Component Value Date   ALT 18 02/18/2022   AST 15 02/18/2022   ALKPHOS 63 02/18/2022   BILITOT 0.2 02/18/2022   Lab Results  Component Value Date   HGBA1C 5.3 02/18/2022   HGBA1C 5.3 05/20/2021   HGBA1C 5.4 05/29/2018   HGBA1C 5.5 01/18/2018   Lab Results  Component Value Date   INSULIN 13.5 02/18/2022  INSULIN 15.5 09/21/2021   INSULIN 27.7 (H) 05/20/2021   INSULIN 46.3 (H) 05/29/2018   INSULIN 22.0 01/18/2018   Lab Results  Component Value Date   TSH 1.930 02/18/2022   Lab Results  Component Value Date   CHOL 176 02/18/2022   HDL 42 02/18/2022   LDLCALC 107 (H) 02/18/2022   TRIG 153 (H) 02/18/2022   CHOLHDL 4.3 05/20/2021   Lab Results  Component Value Date   VD25OH 80.8 02/18/2022   VD25OH 69.1 09/21/2021   VD25OH 38.8 05/20/2021   Lab Results  Component Value Date   WBC 5.1 05/20/2021    HGB 13.9 05/20/2021   HCT 40.3 05/20/2021   MCV 87 05/20/2021   PLT 209 05/20/2021   No results found for: "IRON", "TIBC", "FERRITIN"  Attestation Statements:   Reviewed by clinician on day of visit: allergies, medications, problem list, medical history, surgical history, family history, social history, and previous encounter notes.   I, Burt Knack, am acting as transcriptionist for Quillian Quince, MD.  I have reviewed the above documentation for accuracy and completeness, and I agree with the above. -  Quillian Quince, MD

## 2022-11-09 NOTE — Telephone Encounter (Signed)
Prior Berkley Harvey has been started for [facility] via cover my meds.  Waiting for questions to populate. CaseId:[id number];Status:Cancelled;Explanation:Other. SEE SIMILAR CASE [id number];Appeal Information.  Formulary alternatives:   Benzphetamine (Didrex) Bupropion-naltrexone (Contrave) Diethylpropion (Tenuate) Liraglutide (Saxenda) Lorcaserin (Belviq) Orlistat (Alli, Xenical) Phendimetrazine Phentermine (Adipex-P) Phentermine-topiramate (Qsymia)

## 2022-11-10 ENCOUNTER — Other Ambulatory Visit (HOSPITAL_COMMUNITY): Payer: Self-pay

## 2022-11-15 ENCOUNTER — Other Ambulatory Visit (HOSPITAL_COMMUNITY): Payer: Self-pay

## 2022-11-17 ENCOUNTER — Telehealth (INDEPENDENT_AMBULATORY_CARE_PROVIDER_SITE_OTHER): Payer: Self-pay | Admitting: Family Medicine

## 2022-11-17 ENCOUNTER — Telehealth (INDEPENDENT_AMBULATORY_CARE_PROVIDER_SITE_OTHER): Payer: Self-pay | Admitting: *Deleted

## 2022-11-17 DIAGNOSIS — F3289 Other specified depressive episodes: Secondary | ICD-10-CM

## 2022-11-17 NOTE — Telephone Encounter (Signed)
Called and spoke with patient, informed her the PA was approved in March 2024 until next year. When we tried submitting a new PA, it kicked it back stating please refer to previous PA on file. Patient stated she would give them a call and then let us know how to proceed.

## 2022-11-17 NOTE — Telephone Encounter (Signed)
7/10 Patient is checking on the status of her [facility] Prior Authorization. JE

## 2022-11-17 NOTE — Telephone Encounter (Signed)
Resubmitted PA for [facility].   Cathy Vargas (Key: [id number])  [employer] is reviewing your PA request and will respond within 24 hours for Medicaid or up to 72 hours for non-Medicaid plans, based on the required timeframe determined by state or federal regulations. To check for an update later, open this request from your dashboard.

## 2022-11-18 ENCOUNTER — Other Ambulatory Visit (HOSPITAL_COMMUNITY): Payer: Self-pay

## 2022-11-18 ENCOUNTER — Other Ambulatory Visit: Payer: Self-pay

## 2022-11-18 MED ORDER — BUPROPION HCL ER (SR) 150 MG PO TB12
150.0000 mg | ORAL_TABLET | Freq: Every day | ORAL | 0 refills | Status: DC
Start: 2022-11-18 — End: 2023-01-19

## 2022-11-18 NOTE — Telephone Encounter (Signed)
Ok x 90 days  

## 2022-11-19 ENCOUNTER — Other Ambulatory Visit (HOSPITAL_COMMUNITY): Payer: Self-pay

## 2022-11-22 NOTE — Telephone Encounter (Signed)
Checking on PA status:  still pending:  [facility] is reviewing your PA request and will respond within 24 hours for Medicaid or up to 72 hours for non-Medicaid plans, based on the required timeframe determined by state or federal regulations. To check for an update later, open this request from your dashboard.

## 2022-11-22 NOTE — Telephone Encounter (Signed)
pt called in regarding an PA for [facility] 1.7 has specific instructions from [employer]: The current PA was denied since wrong DX was used should be weight loss. States the office needs to call [employer] and if PA is done over the phone with them, ES will let us know something that same day. They said for [place] not to appeal the denial or it will take longer. [phone] is the number to call for [employer].

## 2022-11-22 NOTE — Telephone Encounter (Signed)
This issue is being resolved in another clinical note.

## 2022-11-22 NOTE — Telephone Encounter (Addendum)
Prior Auth denied.  See note dated 11/08/2022.

## 2022-11-22 NOTE — Telephone Encounter (Addendum)
Call to [employer] and also completed E-PA.  Spoke with Penni Homans and she added the weight loss and diagnosis codes.  Still waiting for prior Auth determination. Case # [id number].

## 2022-11-24 ENCOUNTER — Other Ambulatory Visit (HOSPITAL_COMMUNITY): Payer: Self-pay

## 2022-11-25 ENCOUNTER — Other Ambulatory Visit (HOSPITAL_COMMUNITY): Payer: Self-pay

## 2022-11-26 ENCOUNTER — Other Ambulatory Visit (HOSPITAL_COMMUNITY): Payer: Self-pay

## 2022-11-28 ENCOUNTER — Other Ambulatory Visit: Payer: Self-pay | Admitting: Family

## 2022-11-28 DIAGNOSIS — E039 Hypothyroidism, unspecified: Secondary | ICD-10-CM

## 2022-11-29 NOTE — Telephone Encounter (Signed)
Prior Auth approved for Wegovy 1.7 mg.   Approval dates:  11/12/2022 until 11/26/2023. Patient has been notified.

## 2022-11-29 NOTE — Telephone Encounter (Signed)
VZDGLO:[id number];JJOACZ:[id number]; Review Type:Prior Auth; Appeal Information: Attention:ATTN: CLINICAL APPEALS DEPARTMENT [employer] [id number]. [phone] Phone:[phone] Fax:[phone]; Important - Please read the below note on eAppeals: Please reference the denial letter for information on the rights for an appeal, rationale for the denial, and how to submit an appeal including if any information is needed to support the appeal. Note about urgent situations - Generally, an urgent situation is one which, in the opinion of the provider, the health of the patient may be in serious jeopardy or may experience pain that cannot be adequately controlled while waiting for a decision on the appeal.  Case [id number]: Status:Approved;Review Type:Prior Auth;Coverage Start Date:11/12/2022;Coverage End Date:11/26/2023; RSWNIO:[id number]; Status:Approved;Review Type:Prior Auth;Coverage   Start Date:11/12/2022 Coverage End Date:11/26/2023;

## 2022-11-30 ENCOUNTER — Encounter (INDEPENDENT_AMBULATORY_CARE_PROVIDER_SITE_OTHER): Payer: Self-pay | Admitting: Family Medicine

## 2022-11-30 ENCOUNTER — Encounter: Payer: Self-pay | Admitting: Family

## 2022-11-30 NOTE — Telephone Encounter (Signed)
Please advise 

## 2022-12-07 ENCOUNTER — Ambulatory Visit (INDEPENDENT_AMBULATORY_CARE_PROVIDER_SITE_OTHER): Payer: 59 | Admitting: Family Medicine

## 2022-12-10 ENCOUNTER — Ambulatory Visit (INDEPENDENT_AMBULATORY_CARE_PROVIDER_SITE_OTHER): Payer: 59 | Admitting: Family

## 2022-12-10 DIAGNOSIS — E039 Hypothyroidism, unspecified: Secondary | ICD-10-CM

## 2022-12-10 MED ORDER — LEVOTHYROXINE SODIUM 150 MCG PO TABS
150.0000 ug | ORAL_TABLET | Freq: Every day | ORAL | Status: DC
Start: 2022-12-10 — End: 2022-12-23

## 2022-12-10 NOTE — Assessment & Plan Note (Signed)
chronic TSH wnl in October, T4 checked 05/2021  due for recheck, going to [facility] next month for labs, will do free T4 & TSH advised to let me know when done in case med change needed, pt has continued to lose weight denies any sx today f/u 42yr or prn

## 2022-12-10 NOTE — Progress Notes (Addendum)
Patient ID: Cathy Vargas, female    DOB: 01-Oct-1985, 37 y.o.   MRN: [id number]  Chief Complaint  Patient presents with   Hypothyroidism    HPI: Hypothyroidism: Patient presents today for followup of Hypothyroidism.  Patient reports positive compliance with daily medication.  Patient denies any of the following symptoms: fatigue, cold intolerance, constipation, weight gain or inability to lose weight, muscle weakness, mental slowing, dry hair and skin. Last TSH and free T4: Lab Results  Component Value Date   FREE T4 1.20 05/20/2021   FREE T4 0.85 04/20/2021   FREE T4 1.11 05/29/2018   TSH 1.930 02/18/2022   TSH 2.740 05/20/2021   TSH 1.93 04/20/2021     Assessment & Plan:  Acquired hypothyroidism Assessment & Plan: chronic TSH wnl in October, T4 checked 05/2021  due for recheck, going to [facility] next month for labs, will do free T4 & TSH advised to let me know when done in case med change needed, pt has continued to lose weight denies any sx today f/u 77yr or prn  Orders: -     Levothyroxine Sodium; Take 1 tablet (150 mcg total) by mouth daily before breakfast.   Subjective:    Outpatient Medications Prior to Visit  Medication Sig Dispense Refill   buPROPion (WELLBUTRIN SR) 150 MG 12 hr tablet Take 1 tablet (150 mg) by mouth daily. 90 tablet 0   cholecalciferol (VITAMIN D3) 25 MCG (1000 UNIT) tablet Take 1,000 Units by mouth daily.     loratadine (CLARITIN) 10 MG tablet Take 10 mg by mouth daily.     Norethindrone-Ethinyl Estradiol-Fe Biphas (LO LOESTRIN FE) 1 MG-10 MCG / 10 MCG tablet Take 1 tablet by mouth daily.     Semaglutide-Weight Management (WEGOVY) 1.7 MG/0.75ML SOAJ Inject 1.7 mg into the skin once a week. 3 mL 1   Vitamin D, Ergocalciferol, (DRISDOL) 1.25 MG (50000 UNIT) CAPS capsule Take 1 capsule (50,000 Units total) by mouth every 7 (seven) days. 12 capsule 0   levothyroxine (SYNTHROID) 150 MCG tablet Take 1 tablet (150 mcg total) by mouth daily before  breakfast. 90 tablet 3   meloxicam (MOBIC) 15 MG tablet Take 1 tablet (15 mg total) by mouth daily. 30 tablet 0   No facility-administered medications prior to visit.   Past Medical History:  Diagnosis Date   Abnormal Pap smear of cervix 2015   Anxiety    Back pain    Chest pain    Class 2 severe obesity with serious comorbidity and body mass index (BMI) of 37.0 to 37.9 in adult [facility]) 02/18/2022   Constipation    Gallbladder disease    Gallbladder problem    HTN (hypertension)    Hyperlipemia    Hypothyroidism    Joint pain    Joint pain    PCOS (polycystic ovarian syndrome)    Sciatica    Thyroid disease    Vitamin D deficiency    Past Surgical History:  Procedure Laterality Date   CHOLECYSTECTOMY  2010   DILATION AND CURETTAGE OF UTERUS  2015   OOPHORECTOMY     WISDOM TOOTH EXTRACTION  2001   No Known Allergies    Objective:    Physical Exam Vitals and nursing note reviewed.  Constitutional:      Appearance: Normal appearance.  Cardiovascular:     Rate and Rhythm: Normal rate and regular rhythm.  Pulmonary:     Effort: Pulmonary effort is normal.     Breath sounds: Normal breath  sounds.  Musculoskeletal:        General: Normal range of motion.  Skin:    General: Skin is warm and dry.  Neurological:     Mental Status: She is alert.  Psychiatric:        Mood and Affect: Mood normal.        Behavior: Behavior normal.    Temp (!) 97.3 F (36.3 C) (Temporal)   Ht 5\' 8"  (1.727 m)   Wt 204 lb 2 oz (92.6 kg)   BMI 31.04 kg/m  Wt Readings from Last 3 Encounters:  12/10/22 204 lb 2 oz (92.6 kg)  11/09/22 202 lb (91.6 kg)  10/19/22 202 lb (91.6 kg)      Dulce Sellar, NP

## 2022-12-22 ENCOUNTER — Encounter (INDEPENDENT_AMBULATORY_CARE_PROVIDER_SITE_OTHER): Payer: Self-pay | Admitting: Family Medicine

## 2022-12-22 ENCOUNTER — Ambulatory Visit (INDEPENDENT_AMBULATORY_CARE_PROVIDER_SITE_OTHER): Payer: 59 | Admitting: Family Medicine

## 2022-12-22 VITALS — BP 123/84 | HR 80 | Temp 97.9°F | Ht 68.0 in | Wt 197.0 lb

## 2022-12-22 DIAGNOSIS — E559 Vitamin D deficiency, unspecified: Secondary | ICD-10-CM | POA: Diagnosis not present

## 2022-12-22 DIAGNOSIS — Z6830 Body mass index (BMI) 30.0-30.9, adult: Secondary | ICD-10-CM

## 2022-12-22 DIAGNOSIS — E669 Obesity, unspecified: Secondary | ICD-10-CM

## 2022-12-22 DIAGNOSIS — F3289 Other specified depressive episodes: Secondary | ICD-10-CM

## 2022-12-22 DIAGNOSIS — E039 Hypothyroidism, unspecified: Secondary | ICD-10-CM | POA: Diagnosis not present

## 2022-12-22 DIAGNOSIS — E782 Mixed hyperlipidemia: Secondary | ICD-10-CM

## 2022-12-22 DIAGNOSIS — Z6829 Body mass index (BMI) 29.0-29.9, adult: Secondary | ICD-10-CM

## 2022-12-23 ENCOUNTER — Other Ambulatory Visit: Payer: Self-pay | Admitting: Family

## 2022-12-23 ENCOUNTER — Encounter (INDEPENDENT_AMBULATORY_CARE_PROVIDER_SITE_OTHER): Payer: Self-pay | Admitting: Family Medicine

## 2022-12-23 ENCOUNTER — Encounter: Payer: Self-pay | Admitting: Family

## 2022-12-23 DIAGNOSIS — E039 Hypothyroidism, unspecified: Secondary | ICD-10-CM

## 2022-12-23 LAB — VITAMIN B12: Vitamin B-12: 1328 pg/mL — ABNORMAL HIGH (ref 232–1245)

## 2022-12-23 LAB — T4, FREE: Free T4: 1.56 ng/dL (ref 0.82–1.77)

## 2022-12-23 LAB — LIPID PANEL WITH LDL/HDL RATIO
Cholesterol, Total: 210 mg/dL — ABNORMAL HIGH (ref 100–199)
HDL: 52 mg/dL (ref >39)
LDL Chol Calc (NIH): 138 mg/dL — ABNORMAL HIGH (ref 0–99)
LDL/HDL Ratio: 2.7 ratio (ref 0.0–3.2)
Triglycerides: 109 mg/dL (ref 0–149)
VLDL Cholesterol Cal: 20 mg/dL (ref 5–40)

## 2022-12-23 LAB — HEMOGLOBIN A1C
Est. average glucose Bld gHb Est-mCnc: 97 mg/dL
Hgb A1c MFr Bld: 5 % (ref 4.8–5.6)

## 2022-12-23 LAB — INSULIN, RANDOM: INSULIN: 5.7 u[IU]/mL (ref 2.6–24.9)

## 2022-12-23 LAB — TSH: TSH: 0.472 u[IU]/mL (ref 0.450–4.500)

## 2022-12-23 LAB — VITAMIN D 25 HYDROXY (VIT D DEFICIENCY, FRACTURES): Vit D, 25-Hydroxy: 94.4 ng/mL (ref 30.0–100.0)

## 2022-12-23 MED ORDER — LEVOTHYROXINE SODIUM 150 MCG PO TABS: 150.0000 ug | ORAL_TABLET | Freq: Every day | ORAL | 1 refills | Status: DC

## 2022-12-23 NOTE — Telephone Encounter (Signed)
Please see message. Labs abstracted in chart.

## 2022-12-27 NOTE — Progress Notes (Unsigned)
Chief Complaint:   OBESITY Cathy Vargas is here to discuss her progress with her obesity treatment plan along with follow-up of her obesity related diagnoses. Cathy Vargas is on the Category 3 Plan and states she is following her eating plan approximately 60% of the time. Cathy Vargas states she is walking and using the pedal bike for 30 minutes 2 times per week.  Today's visit was #: 23 Starting weight: 249 lbs Starting date: 05/20/2021 Today's weight: 197 lbs Today's date: 12/22/2022 Total lbs lost to date: 52 Total lbs lost since last in-office visit: 5  Interim History: Patient continues to do well with her weight loss.  Her hunger is mostly controlled, and she is doing well with following her plan and meeting her protein goals.  She is more active as well.  Subjective:   1. Vitamin D deficiency Patient is on vitamin D, and she is due to have labs rechecked today.  2. Acquired hypothyroidism Patient is on Synthroid, and she has lost a lot of weight and her dose may need to be adjusted.  3. Mixed hyperlipidemia Patient is working on her diet, and she is due for labs.  4. Emotional eating behavior Patient is doing well on Wellbutrin, and she notes decreased emotional eating behavior.  No side effects were noted.  Assessment/Plan:   1. Vitamin D deficiency We will check labs today, and we will follow-up at patient's next visit.  - VITAMIN D 25 Hydroxy (Vit-D Deficiency, Fractures)  2. Acquired hypothyroidism We will check labs today, and we will follow-up at patient's next visit.  - TSH - T4, free  3. Mixed hyperlipidemia We will check labs today, and we will follow-up at patient's next visit.  - Hemoglobin A1c - Insulin, random - Lipid Panel With LDL/HDL Ratio - Vitamin B12  4. Emotional eating behavior Patient will continue Wellbutrin SR, and emotional eating behavior strategies were discussed.  5. BMI 30.0-30.9,adult  6. Obesity, Beginning BMI 37.71 Cathy Vargas is currently in the  action stage of change. As such, her goal is to continue with weight loss efforts. She has agreed to the Category 3 Plan.   Patient is to continue Wegovy at 1.7 mg, and we will continue to monitor her progress.  Exercise goals: As is.  Behavioral modification strategies: no skipping meals.  Cathy Vargas has agreed to follow-up with our clinic in 4 weeks. She was informed of the importance of frequent follow-up visits to maximize her success with intensive lifestyle modifications for her multiple health conditions.   Cathy Vargas was informed we would discuss her lab results at her next visit unless there is a critical issue that needs to be addressed sooner. Cathy Vargas agreed to keep her next visit at the agreed upon time to discuss these results.  Objective:   Blood pressure 123/84, pulse 80, temperature 97.9 F (36.6 C), height 5\' 8"  (1.727 m), weight 197 lb (89.4 kg), SpO2 98%. Body mass index is 29.95 kg/m.  Lab Results  Component Value Date   CREATININE 0.83 02/18/2022   BUN 8 02/18/2022   NA 138 02/18/2022   K 4.1 02/18/2022   CL 102 02/18/2022   CO2 19 (L) 02/18/2022   Lab Results  Component Value Date   ALT 18 02/18/2022   AST 15 02/18/2022   ALKPHOS 63 02/18/2022   BILITOT 0.2 02/18/2022   Lab Results  Component Value Date   HGBA1C 5.0 12/22/2022   HGBA1C 5.3 02/18/2022   HGBA1C 5.3 05/20/2021   HGBA1C 5.4 05/29/2018  HGBA1C 5.5 01/18/2018   Lab Results  Component Value Date   INSULIN 5.7 12/22/2022   INSULIN 13.5 02/18/2022   INSULIN 15.5 09/21/2021   INSULIN 27.7 (H) 05/20/2021   INSULIN 46.3 (H) 05/29/2018   Lab Results  Component Value Date   TSH 0.472 12/22/2022   Lab Results  Component Value Date   CHOL 210 (H) 12/22/2022   HDL 52 12/22/2022   LDLCALC 138 (H) 12/22/2022   TRIG 109 12/22/2022   CHOLHDL 4.3 05/20/2021   Lab Results  Component Value Date   VD25OH 94.4 12/22/2022   VD25OH 80.8 02/18/2022   VD25OH 69.1 09/21/2021   Lab Results  Component  Value Date   WBC 5.1 05/20/2021   HGB 13.9 05/20/2021   HCT 40.3 05/20/2021   MCV 87 05/20/2021   PLT 209 05/20/2021   No results found for: "IRON", "TIBC", "FERRITIN"  Attestation Statements:   Reviewed by clinician on day of visit: allergies, medications, problem list, medical history, surgical history, family history, social history, and previous encounter notes.   I, Burt Knack, am acting as transcriptionist for Quillian Quince, MD.  I have reviewed the above documentation for accuracy and completeness, and I agree with the above. -  Quillian Quince, MD

## 2023-01-19 ENCOUNTER — Encounter (INDEPENDENT_AMBULATORY_CARE_PROVIDER_SITE_OTHER): Payer: Self-pay | Admitting: Family Medicine

## 2023-01-19 ENCOUNTER — Other Ambulatory Visit (HOSPITAL_COMMUNITY): Payer: Self-pay

## 2023-01-19 ENCOUNTER — Ambulatory Visit (INDEPENDENT_AMBULATORY_CARE_PROVIDER_SITE_OTHER): Payer: 59 | Admitting: Family Medicine

## 2023-01-19 VITALS — BP 132/83 | HR 82 | Temp 98.3°F | Ht 68.0 in | Wt 194.0 lb

## 2023-01-19 DIAGNOSIS — Z6829 Body mass index (BMI) 29.0-29.9, adult: Secondary | ICD-10-CM | POA: Diagnosis not present

## 2023-01-19 DIAGNOSIS — R632 Polyphagia: Secondary | ICD-10-CM

## 2023-01-19 DIAGNOSIS — F5089 Other specified eating disorder: Secondary | ICD-10-CM | POA: Diagnosis not present

## 2023-01-19 DIAGNOSIS — E669 Obesity, unspecified: Secondary | ICD-10-CM

## 2023-01-19 DIAGNOSIS — F3289 Other specified depressive episodes: Secondary | ICD-10-CM

## 2023-01-19 MED ORDER — WEGOVY 1.7 MG/0.75ML ~~LOC~~ SOAJ
1.7000 mg | SUBCUTANEOUS | 1 refills | Status: DC
  Filled 2023-01-19: qty 3, 28d supply, fill #0

## 2023-01-19 MED ORDER — BUPROPION HCL ER (SR) 150 MG PO TB12
150.0000 mg | ORAL_TABLET | Freq: Every day | ORAL | 0 refills | Status: DC
Start: 2023-01-19 — End: 2023-03-16

## 2023-01-20 ENCOUNTER — Other Ambulatory Visit (HOSPITAL_COMMUNITY): Payer: Self-pay

## 2023-01-26 NOTE — Progress Notes (Unsigned)
Chief Complaint:   OBESITY Cathy Vargas is here to discuss her progress with her obesity treatment plan along with follow-up of her obesity related diagnoses. Cathy Vargas is on the Category 3 Plan and states she is following her eating plan approximately 60% of the time. Cathy Vargas states she is walking and using the pedal bike for 30 minutes 2 times per week.  Today's visit was #: 24 Starting weight: 249 lbs Starting date: 05/20/2021 Today's weight: 194 lbs Today's date: 01/19/2023 Total lbs lost to date: 55 Total lbs lost since last in-office visit: 3  Interim History: Patient is doing well with her weight loss.  She notes excessive hunger is better controlled and she feels she is doing well with meal planning.  She is working on skipping meals less often.  Subjective:   1. Polyphagia Patient is working on her diet and exercise. She is stable on Wegovy and her polyphagia has improved. She has no nausea or vomiting.   2. Emotional Eating Behavior Patient is working on decreasing emotional eating behavior and she is doing well. No side effects were noted on her current medications.   Assessment/Plan:   1. Polyphagia Patient with continue with her medications and diet; we will refill Wegovy 1.7 mg once weekly for 2 months.   - Semaglutide-Weight Management (WEGOVY) 1.7 MG/0.75ML SOAJ; Inject 1.7 mg into the skin once a week.  Dispense: 3 mL; Refill: 1  2. Emotional Eating Behavior We will refill Wellbutrin SR 150 mg once daily for 90 days. Patient will continue working on decreasing emotional eating behavior.   - buPROPion (WELLBUTRIN SR) 150 MG 12 hr tablet; Take 1 tablet (150 mg) by mouth daily.  Dispense: 90 tablet; Refill: 0  3. BMI 29.0-29.9,adult  4. Obesity, Beginning BMI 37.71 Cathy Vargas is currently in the action stage of change. As such, her goal is to continue with weight loss efforts. She has agreed to the Category 3 Plan.   Exercise goals: Increase strengthening exercises.    Behavioral modification strategies: no skipping meals and meal planning and cooking strategies.  Cathy Vargas has agreed to follow-up with our clinic in 4 weeks. She was informed of the importance of frequent follow-up visits to maximize her success with intensive lifestyle modifications for her multiple health conditions.   Objective:   Blood pressure 132/83, pulse 82, temperature 98.3 F (36.8 C), height 5\' 8"  (1.727 m), weight 194 lb (88 kg), SpO2 99%. Body mass index is 29.5 kg/m.  Lab Results  Component Value Date   CREATININE 0.83 02/18/2022   BUN 8 02/18/2022   NA 138 02/18/2022   K 4.1 02/18/2022   CL 102 02/18/2022   CO2 19 (L) 02/18/2022   Lab Results  Component Value Date   ALT 18 02/18/2022   AST 15 02/18/2022   ALKPHOS 63 02/18/2022   BILITOT 0.2 02/18/2022   Lab Results  Component Value Date   HGBA1C 5.0 12/22/2022   HGBA1C 5.3 02/18/2022   HGBA1C 5.3 05/20/2021   HGBA1C 5.4 05/29/2018   HGBA1C 5.5 01/18/2018   Lab Results  Component Value Date   INSULIN 5.7 12/22/2022   INSULIN 13.5 02/18/2022   INSULIN 15.5 09/21/2021   INSULIN 27.7 (H) 05/20/2021   INSULIN 46.3 (H) 05/29/2018   Lab Results  Component Value Date   TSH 0.472 12/22/2022   Lab Results  Component Value Date   CHOL 210 (H) 12/22/2022   HDL 52 12/22/2022   LDLCALC 138 (H) 12/22/2022   TRIG 109  12/22/2022   CHOLHDL 4.3 05/20/2021   Lab Results  Component Value Date   VD25OH 94.4 12/22/2022   VD25OH 80.8 02/18/2022   VD25OH 69.1 09/21/2021   Lab Results  Component Value Date   WBC 5.1 05/20/2021   HGB 13.9 05/20/2021   HCT 40.3 05/20/2021   MCV 87 05/20/2021   PLT 209 05/20/2021   No results found for: "IRON", "TIBC", "FERRITIN"  Attestation Statements:   Reviewed by clinician on day of visit: allergies, medications, problem list, medical history, surgical history, family history, social history, and previous encounter notes.   I, Burt Knack, am acting as  transcriptionist for Quillian Quince, MD.  I have reviewed the above documentation for accuracy and completeness, and I agree with the above. -  Quillian Quince, MD

## 2023-02-16 ENCOUNTER — Other Ambulatory Visit (HOSPITAL_COMMUNITY): Payer: Self-pay

## 2023-02-16 ENCOUNTER — Ambulatory Visit (INDEPENDENT_AMBULATORY_CARE_PROVIDER_SITE_OTHER): Payer: 59 | Admitting: Family Medicine

## 2023-02-16 ENCOUNTER — Encounter (INDEPENDENT_AMBULATORY_CARE_PROVIDER_SITE_OTHER): Payer: Self-pay | Admitting: Family Medicine

## 2023-02-16 VITALS — BP 124/81 | HR 85 | Temp 98.2°F | Ht 68.0 in | Wt 189.0 lb

## 2023-02-16 DIAGNOSIS — K219 Gastro-esophageal reflux disease without esophagitis: Secondary | ICD-10-CM | POA: Diagnosis not present

## 2023-02-16 DIAGNOSIS — R632 Polyphagia: Secondary | ICD-10-CM

## 2023-02-16 DIAGNOSIS — E669 Obesity, unspecified: Secondary | ICD-10-CM

## 2023-02-16 DIAGNOSIS — Z6828 Body mass index (BMI) 28.0-28.9, adult: Secondary | ICD-10-CM

## 2023-02-16 MED ORDER — WEGOVY 1.7 MG/0.75ML ~~LOC~~ SOAJ
1.7000 mg | SUBCUTANEOUS | 1 refills | Status: DC
  Filled 2023-02-16: qty 3, 28d supply, fill #0

## 2023-02-17 ENCOUNTER — Other Ambulatory Visit (HOSPITAL_COMMUNITY): Payer: Self-pay

## 2023-02-17 NOTE — Progress Notes (Signed)
Chief Complaint:   OBESITY Cathy Vargas is here to discuss her progress with her obesity treatment plan along with follow-up of her obesity related diagnoses. Cathy Vargas is on the Category 3 Plan and states she is following her eating plan approximately 70% of the time. Cathy Vargas states she is walking and using the pedal bike for 30 minutes 2 times per week.  Today's visit was #: 25 Starting weight: 249 lbs Starting date: 05/20/2021 Today's weight: 189 lbs Today's date: 02/16/2023 Total lbs lost to date: 60 Total lbs lost since last in-office visit: 5  Interim History: Patient continues to work on her diet and exercise, and she is doing well with her weight loss.  Her hunger is mostly controlled, and she is working on exercise.  Subjective:   1. Polyphagia Patient is doing well on Wegovy.  She notes decreased polyphagia with no nausea or vomiting.  2. Gastroesophageal reflux disease, unspecified whether esophagitis present Patient notes some increase in GERD, maybe once a month, but this is more frequent than it used to be.  Assessment/Plan:   1. Polyphagia Patient will continue Wegovy 1.7 mg subcu weekly, and we will refill for 2 months.  - Semaglutide-Weight Management (WEGOVY) 1.7 MG/0.75ML SOAJ; Inject 1.7 mg into the skin once a week.  Dispense: 3 mL; Refill: 1  2. Gastroesophageal reflux disease, unspecified whether esophagitis present Patient agreed to start OTC Pepcid and Tums as needed, and we will continue to follow.  3. BMI 28.0-28.9,adult  4. Obesity, Beginning BMI 37.71 Cathy Vargas is currently in the action stage of change. As such, her goal is to continue with weight loss efforts. She has agreed to the Category 3 Plan.   Exercise goals: As is.   Behavioral modification strategies: increasing lean protein intake.  Cathy Vargas has agreed to follow-up with our clinic in 4 weeks. She was informed of the importance of frequent follow-up visits to maximize her success with intensive lifestyle  modifications for her multiple health conditions.   Objective:   Blood pressure 124/81, pulse 85, temperature 98.2 F (36.8 C), height 5\' 8"  (1.727 m), weight 189 lb (85.7 kg), SpO2 99%. Body mass index is 28.74 kg/m.  Lab Results  Component Value Date   CREATININE 0.83 02/18/2022   BUN 8 02/18/2022   NA 138 02/18/2022   K 4.1 02/18/2022   CL 102 02/18/2022   CO2 19 (L) 02/18/2022   Lab Results  Component Value Date   ALT 18 02/18/2022   AST 15 02/18/2022   ALKPHOS 63 02/18/2022   BILITOT 0.2 02/18/2022   Lab Results  Component Value Date   HGBA1C 5.0 12/22/2022   HGBA1C 5.3 02/18/2022   HGBA1C 5.3 05/20/2021   HGBA1C 5.4 05/29/2018   HGBA1C 5.5 01/18/2018   Lab Results  Component Value Date   INSULIN 5.7 12/22/2022   INSULIN 13.5 02/18/2022   INSULIN 15.5 09/21/2021   INSULIN 27.7 (H) 05/20/2021   INSULIN 46.3 (H) 05/29/2018   Lab Results  Component Value Date   TSH 0.472 12/22/2022   Lab Results  Component Value Date   CHOL 210 (H) 12/22/2022   HDL 52 12/22/2022   LDLCALC 138 (H) 12/22/2022   TRIG 109 12/22/2022   CHOLHDL 4.3 05/20/2021   Lab Results  Component Value Date   VD25OH 94.4 12/22/2022   VD25OH 80.8 02/18/2022   VD25OH 69.1 09/21/2021   Lab Results  Component Value Date   WBC 5.1 05/20/2021   HGB 13.9 05/20/2021   HCT  40.3 05/20/2021   MCV 87 05/20/2021   PLT 209 05/20/2021   No results found for: "IRON", "TIBC", "FERRITIN"  Attestation Statements:   Reviewed by clinician on day of visit: allergies, medications, problem list, medical history, surgical history, family history, social history, and previous encounter notes.   I, Burt Knack, am acting as transcriptionist for Quillian Quince, MD.  I have reviewed the above documentation for accuracy and completeness, and I agree with the above. -  Quillian Quince, MD

## 2023-03-16 ENCOUNTER — Other Ambulatory Visit (HOSPITAL_COMMUNITY): Payer: Self-pay

## 2023-03-16 ENCOUNTER — Ambulatory Visit (INDEPENDENT_AMBULATORY_CARE_PROVIDER_SITE_OTHER): Payer: 59 | Admitting: Family Medicine

## 2023-03-16 ENCOUNTER — Encounter (INDEPENDENT_AMBULATORY_CARE_PROVIDER_SITE_OTHER): Payer: Self-pay | Admitting: Family Medicine

## 2023-03-16 VITALS — BP 124/78 | HR 73 | Temp 98.0°F | Ht 68.0 in | Wt 187.0 lb

## 2023-03-16 DIAGNOSIS — F3289 Other specified depressive episodes: Secondary | ICD-10-CM | POA: Diagnosis not present

## 2023-03-16 DIAGNOSIS — R632 Polyphagia: Secondary | ICD-10-CM

## 2023-03-16 DIAGNOSIS — E669 Obesity, unspecified: Secondary | ICD-10-CM | POA: Diagnosis not present

## 2023-03-16 DIAGNOSIS — Z6828 Body mass index (BMI) 28.0-28.9, adult: Secondary | ICD-10-CM

## 2023-03-16 MED ORDER — BUPROPION HCL ER (SR) 150 MG PO TB12
150.0000 mg | ORAL_TABLET | Freq: Every day | ORAL | 0 refills | Status: DC
Start: 2023-03-16 — End: 2023-07-06

## 2023-03-16 MED ORDER — WEGOVY 1.7 MG/0.75ML ~~LOC~~ SOAJ
1.7000 mg | SUBCUTANEOUS | 1 refills | Status: DC
Start: 2023-03-16 — End: 2023-05-12
  Filled 2023-03-16: qty 3, 28d supply, fill #0
  Filled 2023-04-13: qty 3, 28d supply, fill #1

## 2023-03-16 NOTE — Progress Notes (Signed)
.[facility]  Office: [phone]  /  Fax: [phone]  WEIGHT SUMMARY AND BIOMETRICS  Anthropometric Measurements Height: 5\' 8"  (1.727 m) Weight: 187 lb (84.8 kg) BMI (Calculated): 28.44 Weight at Last Visit: 189 lb Weight Lost Since Last Visit: 2 lb Weight Gained Since Last Visit: 0 Starting Weight: 248 lb Total Weight Loss (lbs): 62 lb (28.1 kg)   Body Composition  Body Fat %: 34.8 % Fat Mass (lbs): 65.4 lbs Muscle Mass (lbs): 116.2 lbs Total Body Water (lbs): 81.6 lbs Visceral Fat Rating : 6   Other Clinical Data Fasting: No Labs: No Today's Visit #: 26 Starting Date: 01/18/18    Chief Complaint: OBESITY    History of Present Illness   The patient, with a history of obesity and emotional eating behaviors, presents for a follow-up visit. She reports a weight loss of two pounds since the last visit a month ago and adherence to a category three eating plan about 50% of the time. She has been engaging in physical activity, including walking and bike peddling, for about 30 minutes twice a week.  The patient experienced challenges with dietary control during the recent Halloween holiday, particularly due to the presence of candy in the home. However, she reports better control compared to previous years. She has been struggling with protein intake, particularly with consistency in consuming protein shakes. She reports feeling off in the past week, with increased nausea and decreased appetite, which she attributes to menstruation and recent dietary indiscretions.  The patient has been using reminders on her phone to help with medication adherence and plans to incorporate similar reminders for dietary habits. She reports good sleep and no issues with blood pressure. She has been engaging in outdoor activities, such as walking in state parks and trails, particularly on weekends, walking between four to six miles. She expresses a desire to incorporate more strengthening exercises into  her routine.  The patient anticipates a healthier food environment for the upcoming Thanksgiving holiday, as she will be visiting health-conscious relatives. She plans to use portion control strategies to manage her intake during the holiday meal. She expresses confidence in her ability to manage her dietary habits during this period, particularly as she will not be bringing home any leftovers. She is scheduled to travel for work the following month and is considering options for her next follow-up visit.          PHYSICAL EXAM:  Blood pressure 124/78, pulse 73, temperature 98 F (36.7 C), height 5\' 8"  (1.727 m), weight 187 lb (84.8 kg), SpO2 99%. Body mass index is 28.43 kg/m.  DIAGNOSTIC DATA REVIEWED:  BMET    Component Value Date/Time   NA 138 02/18/2022 0742   K 4.1 02/18/2022 0742   CL 102 02/18/2022 0742   CO2 19 (L) 02/18/2022 0742   GLUCOSE 88 02/18/2022 0742   GLUCOSE 95 04/20/2021 1019   BUN 8 02/18/2022 0742   CREATININE 0.83 02/18/2022 0742   CALCIUM 9.2 02/18/2022 0742   GFRNONAA >60 12/03/2020 1649   GFRAA 124 05/29/2018 1049   Lab Results  Component Value Date   HGBA1C 5.0 12/22/2022   HGBA1C 5.5 01/18/2018   Lab Results  Component Value Date   INSULIN 5.7 12/22/2022   INSULIN 22.0 01/18/2018   Lab Results  Component Value Date   TSH 0.472 12/22/2022   CBC    Component Value Date/Time   WBC 5.1 05/20/2021 0935   WBC 4.9 04/20/2021 1019   RBC 4.65 05/20/2021 0935  RBC 4.55 04/20/2021 1019   HGB 13.9 05/20/2021 0935   HCT 40.3 05/20/2021 0935   PLT 209 05/20/2021 0935   MCV 87 05/20/2021 0935   MCH 29.9 05/20/2021 0935   MCH 30.2 12/03/2020 1649   MCHC 34.5 05/20/2021 0935   MCHC 34.1 04/20/2021 1019   RDW 12.4 05/20/2021 0935   Iron Studies No results found for: "IRON", "TIBC", "FERRITIN", "IRONPCTSAT" Lipid Panel     Component Value Date/Time   CHOL 210 (H) 12/22/2022 1244   TRIG 109 12/22/2022 1244   HDL 52 12/22/2022 1244    CHOLHDL 4.3 05/20/2021 0935   CHOLHDL 4 04/20/2021 1019   VLDL 34.6 04/20/2021 1019   LDLCALC 138 (H) 12/22/2022 1244   Hepatic Function Panel     Component Value Date/Time   PROT 6.7 02/18/2022 0742   ALBUMIN 4.2 02/18/2022 0742   AST 15 02/18/2022 0742   ALT 18 02/18/2022 0742   ALKPHOS 63 02/18/2022 0742   BILITOT 0.2 02/18/2022 0742      Component Value Date/Time   TSH 0.472 12/22/2022 1244   Nutritional Lab Results  Component Value Date   VD25OH 94.4 12/22/2022   VD25OH 80.8 02/18/2022   VD25OH 69.1 09/21/2021     Assessment and Plan    Obesity with polyphagia Patient has lost 2 pounds in the last month and is adhering to category three eating plan 50% of the time. She is exercising twice a week. She has been experiencing some challenges with emotional eating, particularly around holidays and has identified strategies to manage this, such as keeping tempting foods out of sight. She has also identified the need for reminders to maintain her protein intake. -Continue current exercise regimen and category three eating plan. -Consider using reminders to maintain protein intake. -Continue strategies to manage emotional eating, particularly around holidays.   Emotional eating behaviors Patient is currently on bupropion. She is working on decreasing EEB -Refill bupropion prescriptions.   Follow-up Due to patient's travel plans, next appointment will be scheduled for the beginning of January, approximately 8 weeks from the current visit. -Cancel December appointment. -Schedule follow-up appointment for the beginning of January.         She was informed of the importance of frequent follow up visits to maximize her success with intensive lifestyle modifications for her multiple health conditions.    Quillian Quince, MD

## 2023-03-17 ENCOUNTER — Other Ambulatory Visit (HOSPITAL_COMMUNITY): Payer: Self-pay

## 2023-04-13 ENCOUNTER — Other Ambulatory Visit (HOSPITAL_COMMUNITY): Payer: Self-pay

## 2023-04-18 ENCOUNTER — Ambulatory Visit (INDEPENDENT_AMBULATORY_CARE_PROVIDER_SITE_OTHER): Payer: 59 | Admitting: Family Medicine

## 2023-04-25 ENCOUNTER — Encounter: Payer: Self-pay | Admitting: Family

## 2023-04-25 ENCOUNTER — Ambulatory Visit (INDEPENDENT_AMBULATORY_CARE_PROVIDER_SITE_OTHER): Payer: 59 | Admitting: Family

## 2023-04-25 VITALS — BP 128/86 | HR 81 | Temp 97.3°F | Ht 68.0 in | Wt 190.1 lb

## 2023-04-25 DIAGNOSIS — E7849 Other hyperlipidemia: Secondary | ICD-10-CM

## 2023-04-25 DIAGNOSIS — E039 Hypothyroidism, unspecified: Secondary | ICD-10-CM

## 2023-04-25 DIAGNOSIS — Z1159 Encounter for screening for other viral diseases: Secondary | ICD-10-CM | POA: Diagnosis not present

## 2023-04-25 DIAGNOSIS — Z0000 Encounter for general adult medical examination without abnormal findings: Secondary | ICD-10-CM | POA: Diagnosis not present

## 2023-04-25 DIAGNOSIS — Z114 Encounter for screening for human immunodeficiency virus [HIV]: Secondary | ICD-10-CM

## 2023-04-25 LAB — CBC WITH DIFFERENTIAL/PLATELET
Basophils Absolute: 0 10*3/uL (ref 0.0–0.1)
Basophils Relative: 0.8 % (ref 0.0–3.0)
Eosinophils Absolute: 0.1 10*3/uL (ref 0.0–0.7)
Eosinophils Relative: 1.4 % (ref 0.0–5.0)
HCT: 39.4 % (ref 36.0–46.0)
Hemoglobin: 13.7 g/dL (ref 12.0–15.0)
Lymphocytes Relative: 26.4 % (ref 12.0–46.0)
Lymphs Abs: 1 10*3/uL (ref 0.7–4.0)
MCHC: 34.9 g/dL (ref 30.0–36.0)
MCV: 89 fL (ref 78.0–100.0)
Monocytes Absolute: 0.2 10*3/uL (ref 0.1–1.0)
Monocytes Relative: 6 % (ref 3.0–12.0)
Neutro Abs: 2.5 10*3/uL (ref 1.4–7.7)
Neutrophils Relative %: 65.4 % (ref 43.0–77.0)
Platelets: 209 10*3/uL (ref 150.0–400.0)
RBC: 4.43 Mil/uL (ref 3.87–5.11)
RDW: 12.8 % (ref 11.5–15.5)
WBC: 3.9 10*3/uL — ABNORMAL LOW (ref 4.0–10.5)

## 2023-04-25 LAB — COMPREHENSIVE METABOLIC PANEL
ALT: 14 U/L (ref 0–35)
AST: 14 U/L (ref 0–37)
Albumin: 4.1 g/dL (ref 3.5–5.2)
Alkaline Phosphatase: 43 U/L (ref 39–117)
BUN: 10 mg/dL (ref 6–23)
CO2: 27 meq/L (ref 19–32)
Calcium: 8.9 mg/dL (ref 8.4–10.5)
Chloride: 105 meq/L (ref 96–112)
Creatinine, Ser: 0.87 mg/dL (ref 0.40–1.20)
GFR: 83.98 mL/min (ref >60.00)
Glucose, Bld: 92 mg/dL (ref 70–99)
Potassium: 3.9 meq/L (ref 3.5–5.1)
Sodium: 139 meq/L (ref 135–145)
Total Bilirubin: 0.4 mg/dL (ref 0.2–1.2)
Total Protein: 6.8 g/dL (ref 6.0–8.3)

## 2023-04-25 LAB — LIPID PANEL
Cholesterol: 180 mg/dL (ref 0–200)
HDL: 48 mg/dL (ref >39.00)
LDL Cholesterol: 108 mg/dL — ABNORMAL HIGH (ref 0–99)
NonHDL: 131.55
Total CHOL/HDL Ratio: 4
Triglycerides: 117 mg/dL (ref 0.0–149.0)
VLDL: 23.4 mg/dL (ref 0.0–40.0)

## 2023-04-25 LAB — TSH: TSH: 0.62 u[IU]/mL (ref 0.35–5.50)

## 2023-04-25 NOTE — Progress Notes (Signed)
Phone [phone]  Subjective:   Patient is a 37 y.o. female presenting for annual physical.    Chief Complaint  Patient presents with   Annual Exam    Fasting w/ labs    See problem oriented charting- ROS- full  review of systems was completed and negative.  The following were reviewed and entered/updated in epic: Past Medical History:  Diagnosis Date   Abnormal Pap smear of cervix 2015   Anxiety    Back pain    Chest pain    Class 2 severe obesity with serious comorbidity and body mass index (BMI) of 37.0 to 37.9 in adult (HCC) 02/18/2022   Constipation    Gallbladder disease    Gallbladder problem    HTN (hypertension)    Hyperlipemia    Hypothyroidism    Joint pain    Joint pain    PCOS (polycystic ovarian syndrome)    Sciatica    Thyroid disease    Vitamin D deficiency    Patient Active Problem List   Diagnosis Date Noted   Gastroesophageal reflux disease 02/16/2023   BMI 28.0-28.9,adult 02/16/2023   Other secondary hypertension 11/09/2022   Polyphagia 09/23/2022   Elevated blood pressure reading without diagnosis of hypertension 09/23/2022   Depression 09/23/2022   BMI 31.0-31.9,adult 07/01/2022   Obesity, Beginning BMI 37.71 06/02/2022   Vitamin D deficiency 02/18/2022   Insulin resistance 02/18/2022   Mixed hyperlipidemia 02/18/2022   Obesity (BMI 30-39.9) 04/20/2021   Annual physical exam 04/20/2021   Acquired hypothyroidism 04/20/2021   Past Surgical History:  Procedure Laterality Date   CHOLECYSTECTOMY  2010   DILATION AND CURETTAGE OF UTERUS  2015   OOPHORECTOMY     WISDOM TOOTH EXTRACTION  2001    Family History  Problem Relation Age of Onset   Obesity Mother    Obesity Father    Hyperlipidemia Father    Alcoholism Father    Drug abuse Brother    Stroke Maternal Grandmother    Heart disease Maternal Grandfather    Cancer Paternal Grandfather     Medications- reviewed and updated Current Outpatient Medications  Medication Sig  Dispense Refill   buPROPion (WELLBUTRIN SR) 150 MG 12 hr tablet Take 1 tablet (150 mg) by mouth daily. 90 tablet 0   levothyroxine (SYNTHROID) 150 MCG tablet Take 1 tablet (150 mcg total) by mouth daily before breakfast. 90 tablet 1   loratadine (CLARITIN) 10 MG tablet Take 10 mg by mouth daily.     Norethindrone-Ethinyl Estradiol-Fe Biphas (LO LOESTRIN FE) 1 MG-10 MCG / 10 MCG tablet Take 1 tablet by mouth daily.     Semaglutide-Weight Management (WEGOVY) 1.7 MG/0.75ML SOAJ Inject 1.7 mg into the skin once a week. 3 mL 1   No current facility-administered medications for this visit.    Allergies-reviewed and updated No Known Allergies  Social History   Social History Narrative   Not on file    Objective:  BP 128/86 (BP Location: Left Arm, Patient Position: Sitting, Cuff Size: Large)   Pulse 81   Temp (!) 97.3 F (36.3 C) (Temporal)   Ht 5\' 8"  (1.727 m)   Wt 190 lb 2 oz (86.2 kg)   LMP 04/11/2023 (Approximate)   SpO2 100%   BMI 28.91 kg/m  Physical Exam Vitals and nursing note reviewed.  Constitutional:      Appearance: Normal appearance.  HENT:     Head: Normocephalic.     Right Ear: Tympanic membrane normal.     Left  Ear: Tympanic membrane normal.     Nose: Nose normal.     Mouth/Throat:     Mouth: Mucous membranes are moist.  Eyes:     Pupils: Pupils are equal, round, and reactive to light.  Cardiovascular:     Rate and Rhythm: Normal rate and regular rhythm.  Pulmonary:     Effort: Pulmonary effort is normal.     Breath sounds: Normal breath sounds.  Musculoskeletal:        General: Normal range of motion.     Cervical back: Normal range of motion.  Lymphadenopathy:     Cervical: No cervical adenopathy.  Skin:    General: Skin is warm and dry.  Neurological:     Mental Status: She is alert.  Psychiatric:        Mood and Affect: Mood normal.        Behavior: Behavior normal.      Assessment and Plan   Health Maintenance counseling: 1. Anticipatory  guidance: Patient counseled regarding regular dental exams q6 months, eye exams,  avoiding smoking and second hand smoke, limiting alcohol to 1 beverage per day, no illicit drugs.   2. Risk factor reduction:  Advised patient of need for regular exercise and diet rich with fruits and vegetables to reduce risk of heart attack and stroke. Wt Readings from Last 3 Encounters:  04/25/23 190 lb 2 oz (86.2 kg)  03/16/23 187 lb (84.8 kg)  02/16/23 189 lb (85.7 kg)   3. Immunizations/screenings/ancillary studies Immunization History  Administered Date(s) Administered   PFIZER(Purple Top)SARS-COV-2 Vaccination 08/24/2019, 09/14/2019, 04/18/2020   Td 10/10/2017   Health Maintenance Due  Topic Date Due   HIV Screening  Never done    4. Cervical cancer screening: last one 04/2021 5. Skin cancer screening- advised regular sunscreen use. Denies worrisome, changing, or new skin lesions. Recommend pt see Dermatology for screening due to fair skin. 6. Birth control/STD check: OCP 7. Smoking associated screening: non-smoker 8. Alcohol screening: social  Screening for HIV (human immunodeficiency virus) -     HIV Antibody (routine testing w rflx)  Need for hepatitis C screening test -     Hepatitis C antibody  Annual physical exam -     CBC with Differential/Platelet -     Comprehensive metabolic panel  Other hyperlipidemia -     Lipid panel  Acquired hypothyroidism -     TSH   Recommended follow up:  No follow-ups on file. Future Appointments  Date Time Provider Department Center  05/18/2023  4:00 PM Wilder Glade, MD [facility] None    Lab/Order associations: fasting    Dulce Sellar, NP

## 2023-04-25 NOTE — Patient Instructions (Signed)
It was very nice to see you today!   I will review your lab results via MyChart in a few days.  Have a [employer] Christmas!     PLEASE NOTE:  If you had any lab tests please let us know if you have not heard back within a few days. You may see your results on MyChart before we have a chance to review them but we will give you a call once they are reviewed by [place]. If we ordered any referrals today, please let us know if you have not heard from their office within the next week.

## 2023-04-26 LAB — HEPATITIS C ANTIBODY: Hepatitis C Ab: NONREACTIVE

## 2023-04-26 LAB — HIV ANTIBODY (ROUTINE TESTING W REFLEX): HIV 1&2 Ab, 4th Generation: NONREACTIVE

## 2023-05-10 ENCOUNTER — Other Ambulatory Visit (INDEPENDENT_AMBULATORY_CARE_PROVIDER_SITE_OTHER): Payer: Self-pay | Admitting: Family Medicine

## 2023-05-10 ENCOUNTER — Encounter (INDEPENDENT_AMBULATORY_CARE_PROVIDER_SITE_OTHER): Payer: Self-pay | Admitting: Family Medicine

## 2023-05-10 DIAGNOSIS — R632 Polyphagia: Secondary | ICD-10-CM

## 2023-05-12 ENCOUNTER — Other Ambulatory Visit (INDEPENDENT_AMBULATORY_CARE_PROVIDER_SITE_OTHER): Payer: Self-pay

## 2023-05-12 ENCOUNTER — Other Ambulatory Visit (HOSPITAL_COMMUNITY): Payer: Self-pay

## 2023-05-12 DIAGNOSIS — R632 Polyphagia: Secondary | ICD-10-CM

## 2023-05-12 MED ORDER — WEGOVY 1.7 MG/0.75ML ~~LOC~~ SOAJ
1.7000 mg | SUBCUTANEOUS | 0 refills | Status: DC
  Filled 2023-05-12: qty 3, 28d supply, fill #0

## 2023-05-12 NOTE — Telephone Encounter (Signed)
 Ok to refill Wegovy  or refill at visit on 05/18/23   LAST APPOINTMENT DATE: 03/16/23 NEXT APPOINTMENT DATE: 05/18/23   [id number] - [place], [place] - [address] [place] [address] Phone: [phone] Fax: [phone]  [facility] - [place], [place] - [address] [place] [address] Phone: [phone] Fax: [phone]  [facility] [phone] [place], [place] - [address] AT [facility] [address] &  [facility] [address] [place] [address] Phone: [phone] Fax: [phone]  [facility] [address] [place] [address] Phone: [phone] Fax: [phone]  Patient is requesting a refill of the following medications: No prescriptions requested or ordered in this encounter   Date last filled: 03/16/23 Previously prescribed by Dr Verdon  Lab Results      Component                Value               Date                      HGBA1C                   5.0                 12/22/2022                HGBA1C                   5.3                 02/18/2022                HGBA1C                   5.3                 05/20/2021           Lab Results      Component                Value               Date                      LDLCALC                  108 (H)             04/25/2023                CREATININE               0.87                04/25/2023           Lab Results      Component                Value               Date                      VD25OH                   94.4  12/22/2022                VD25OH                   80.8                02/18/2022                VD25OH                   69.1                09/21/2021            BP Readings from Last 3 Encounters: 04/25/23 : 128/86 03/16/23 : 124/78 02/16/23 : 124/81

## 2023-05-17 IMAGING — CT CT CARDIAC CORONARY ARTERY CALCIUM SCORE
3 series · 14 of 20 positions shown, 16 images · non-contrast
Comparison: 12/03/2020 CTA chest
COMPARISON: 12/03/2020 CTA chest

Addendum:
EXAM:
OVER-READ INTERPRETATION  CT CHEST

The following report is an over-read performed by radiologist Dr.
over-read does not include interpretation of cardiac or coronary
anatomy or pathology. The calcium score interpretation by the
cardiologist is attached.
CLINICAL DATA: Cardiovascular Disease Risk stratification
Coronary Calcium Score
TECHNIQUE: A gated, non-contrast computed tomography scan of the heart was
performed using 3mm slice thickness. Axial images were analyzed on a
dedicated workstation. Calcium scoring of the coronary arteries was
performed using the Agatston method.

[Series 2: cascseq 2.0 sa36 (id) (id) · axial · 0.39mm/px · z∈[-263,-173]mm · 4 of 76 slices shown]
[im 16/76  vessel]
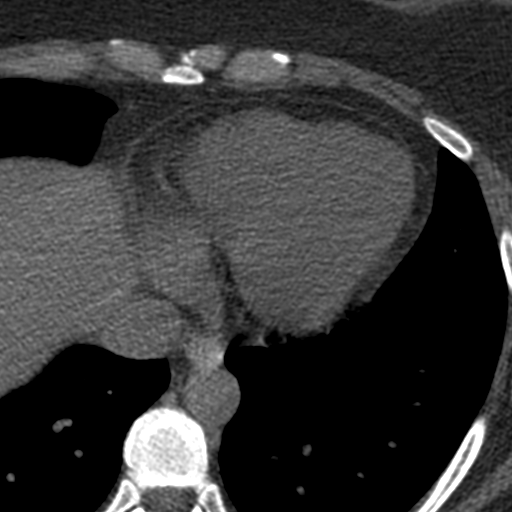
[im 31/76  vessel]
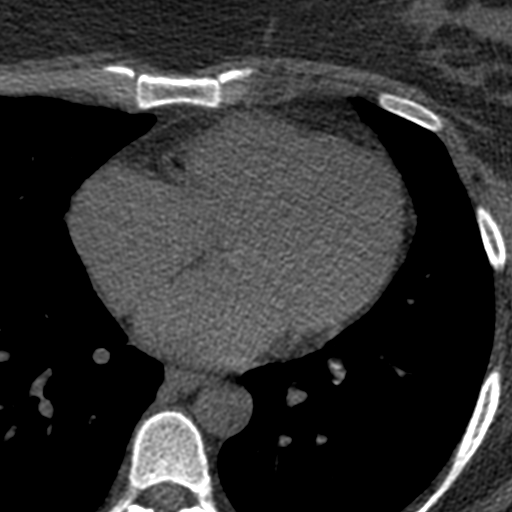
[im 46/76  vessel]
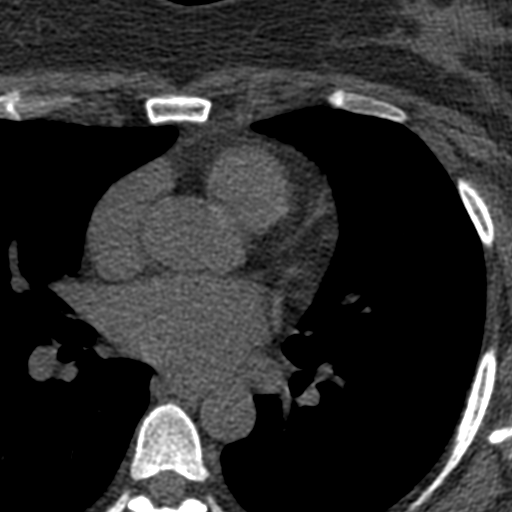
[im 61/76  vessel]
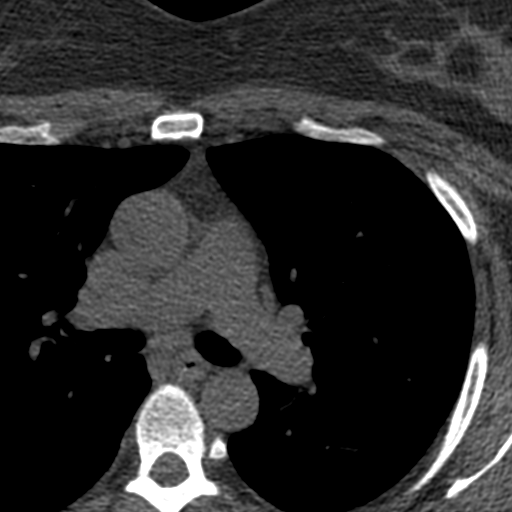

[Series 3: cascseq 2.0 bf37 st · axial · 0.77mm/px · z∈[-269,-169]mm · 5 of 76 slices shown, 7 images]
[im 13/76  vessel]
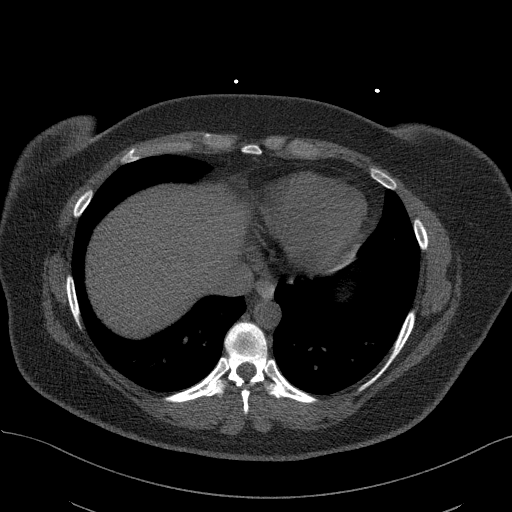
[im 13/76  lung]
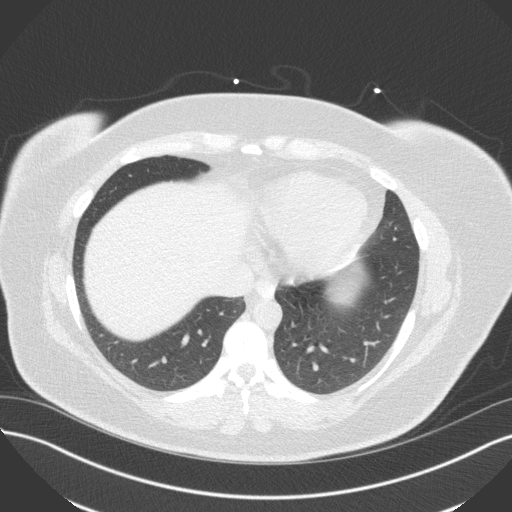
[im 26/76  vessel]
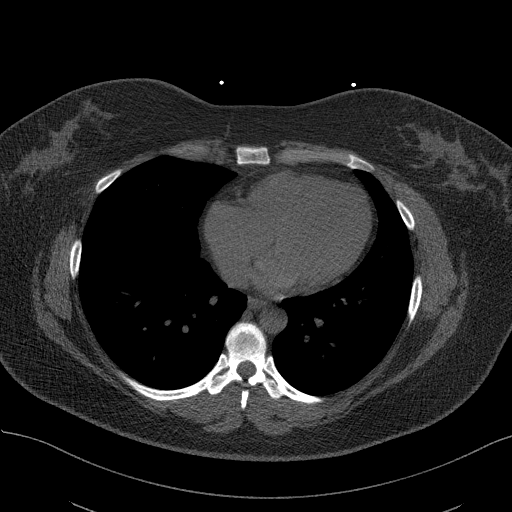
[im 38/76  vessel]
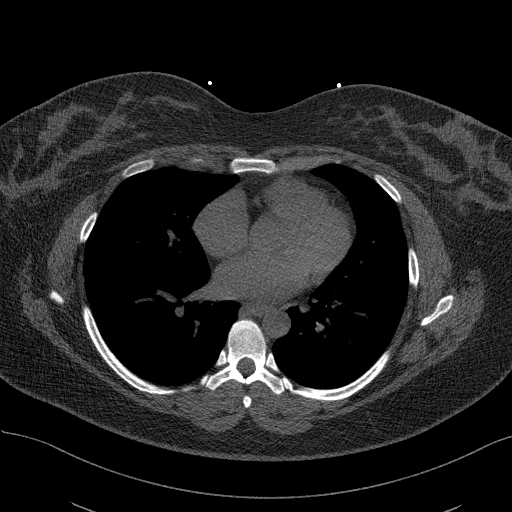
[im 51/76  vessel]
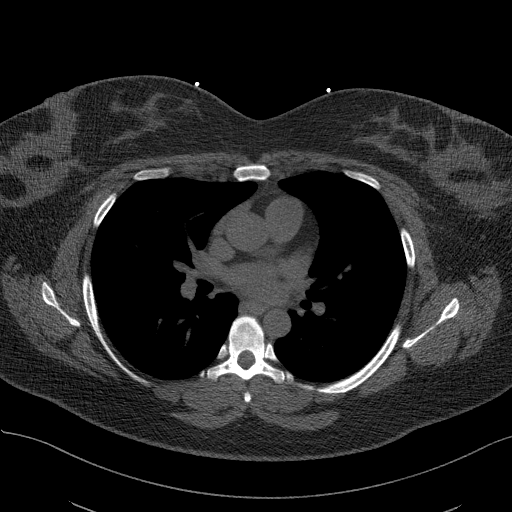
[im 63/76  vessel]
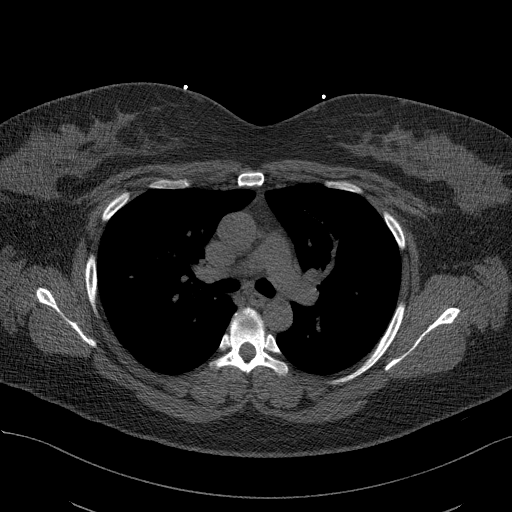
[im 63/76  lung]
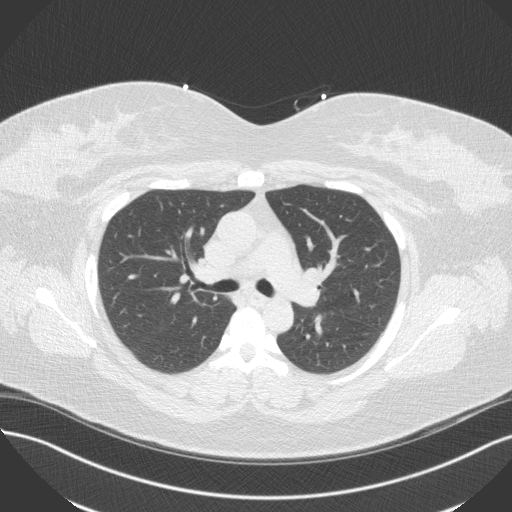

[Series 4: cascseq 2.0 br59 lung · axial · 0.77mm/px · z∈[-269,-169]mm · 5 of 76 slices shown]
[im 13/76  lung]
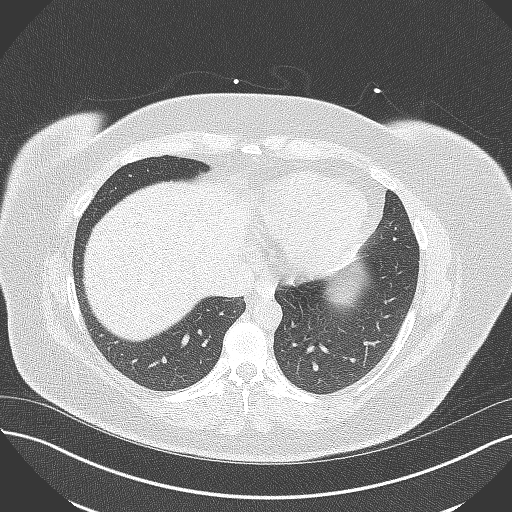
[im 26/76  lung]
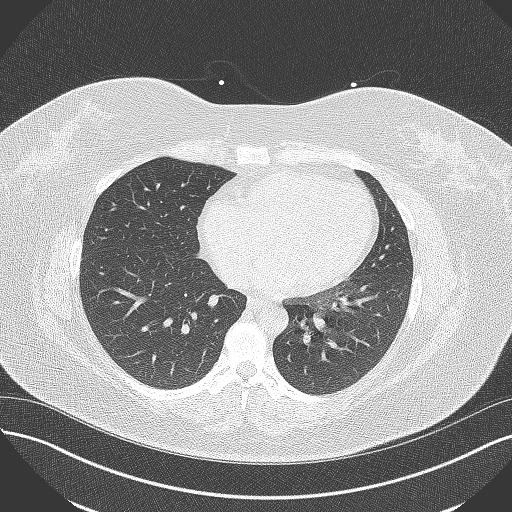
[im 38/76  lung]
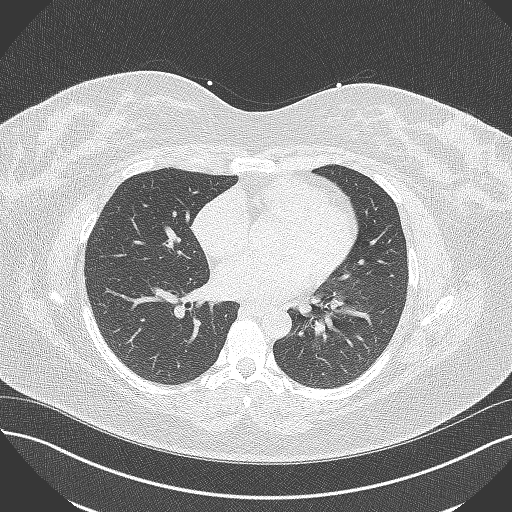
[im 51/76  lung]
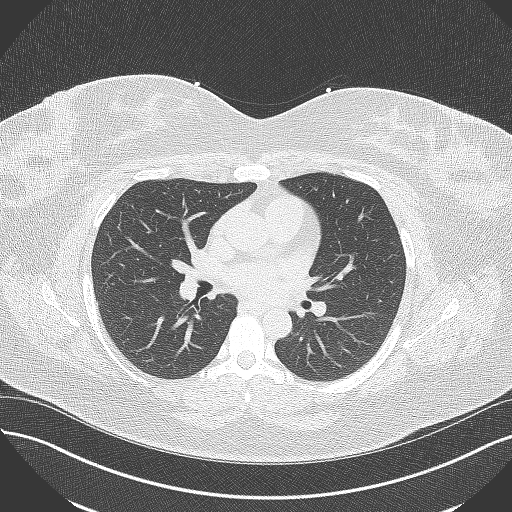
[im 63/76  lung]
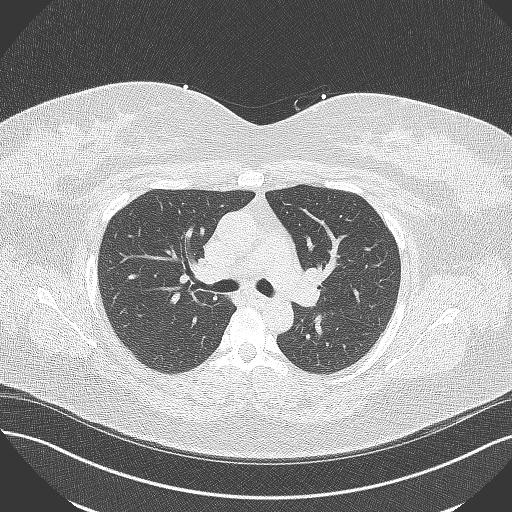

[14 of 20 positions shown; findings below may reference images not displayed]

FINDINGS: Vascular: Normal aortic caliber.  Tortuous thoracic aorta.

Mediastinum/Nodes: No imaged thoracic adenopathy.

Lungs/Pleura: No pleural fluid.  Clear imaged lungs.

Upper Abdomen: Normal imaged portions of the liver, spleen, stomach.

Musculoskeletal: No acute osseous abnormality.
IMPRESSION: No acute findings in the imaged extracardiac chest.
FINDINGS: Coronary arteries: Normal origins.

Coronary Calcium Score:

Left main: 0

Left anterior descending artery: 0

Left circumflex artery: 0

Right coronary artery:

Total:

Percentile: 37th

Pericardium: Normal.

Ascending Aorta: Normal caliber.

Non-cardiac: See separate report from [REDACTED].
IMPRESSION: Coronary calcium score of 3.76. This was 37th percentile for age-,
race-, and sex-matched controls.



If CAC=0, it is reasonable to withhold statin therapy and reassess
in 5 to 10 years, as long as higher risk conditions are absent
(diabetes mellitus, family history of premature CHD in first degree
relatives (males <55 years; females <65 years), cigarette smoking,
or LDL >=190 mg/dL).

If CAC is 1 to 99, it is reasonable to initiate statin therapy for
patients >=55 years of age.

If CAC is >=100 or >=75th percentile, it is reasonable to initiate
statin therapy at any age.

Cardiology referral should be considered for patients with CAC
scores >=400 or >=75th percentile.

*8675 AHA/ACC/AACVPR/AAPA/ABC/MAIBE/WINCY/MAMALUBA/Heindrich/SAMPAYO/QUIRIJN/MAHECHA
Guideline on the Management of Blood Cholesterol: A Report of the
American College of Cardiology/American Heart Association Task Force
on Clinical Practice Guidelines. J Am Coll Cardiol.
5787;73(24):4213-4093.

*** End of Addendum ***
EXAM:
OVER-READ INTERPRETATION  CT CHEST

The following report is an over-read performed by radiologist Dr.
over-read does not include interpretation of cardiac or coronary
anatomy or pathology. The calcium score interpretation by the
cardiologist is attached.
FINDINGS: Vascular: Normal aortic caliber.  Tortuous thoracic aorta.

Mediastinum/Nodes: No imaged thoracic adenopathy.

Lungs/Pleura: No pleural fluid.  Clear imaged lungs.

Upper Abdomen: Normal imaged portions of the liver, spleen, stomach.

Musculoskeletal: No acute osseous abnormality.
IMPRESSION: No acute findings in the imaged extracardiac chest.

## 2023-05-18 ENCOUNTER — Encounter (INDEPENDENT_AMBULATORY_CARE_PROVIDER_SITE_OTHER): Payer: Self-pay | Admitting: Family Medicine

## 2023-05-18 ENCOUNTER — Ambulatory Visit (INDEPENDENT_AMBULATORY_CARE_PROVIDER_SITE_OTHER): Payer: 59 | Admitting: Family Medicine

## 2023-05-18 VITALS — BP 123/85 | HR 85 | Temp 97.9°F | Ht 68.0 in | Wt 180.0 lb

## 2023-05-18 DIAGNOSIS — F3289 Other specified depressive episodes: Secondary | ICD-10-CM

## 2023-05-18 DIAGNOSIS — Z6827 Body mass index (BMI) 27.0-27.9, adult: Secondary | ICD-10-CM

## 2023-05-18 DIAGNOSIS — R632 Polyphagia: Secondary | ICD-10-CM

## 2023-05-18 DIAGNOSIS — E669 Obesity, unspecified: Secondary | ICD-10-CM | POA: Diagnosis not present

## 2023-05-18 MED ORDER — WEGOVY 1.7 MG/0.75ML ~~LOC~~ SOAJ
1.7000 mg | SUBCUTANEOUS | 0 refills | Status: DC
  Filled 2023-05-18 – 2023-06-04 (×2): qty 3, 28d supply, fill #0

## 2023-05-18 NOTE — Progress Notes (Signed)
 .smr  Office: [phone]  /  Fax: [phone]  WEIGHT SUMMARY AND BIOMETRICS  Anthropometric Measurements Height: 5' 8 (1.727 m) Weight: 180 lb (81.6 kg) BMI (Calculated): 27.38 Weight at Last Visit: 187 lb Weight Lost Since Last Visit: 7 lb Weight Gained Since Last Visit: 0 Total Weight Loss (lbs): 69 lb (31.3 kg)   Body Composition  Body Fat %: 32.8 % Fat Mass (lbs): 59.2 lbs Muscle Mass (lbs): 115.2 lbs Total Body Water (lbs): 79.8 lbs Visceral Fat Rating : 5   Other Clinical Data Fasting: No Labs: No Today's Visit #: 27    Chief Complaint: OBESITY   History of Present Illness   The patient, with a history of obesity, has been actively working on weight loss through diet and exercise. Over the past two months, she has lost seven pounds. She reports adherence to her category three eating plan about sixty percent of the time and engages in physical activity using a pedal bike for thirty minutes twice a week. She is currently on Wellbutrin  SR 150mg  daily to manage emotional eating behaviors and Wegovy  1.7mg  weekly for polyphagia.  During the holiday season, the patient made conscious efforts to avoid overindulgence in food, particularly desserts and cookies. She also spent time outdoors before the onset of cold weather. She has recently purchased a walking pad treadmill for her office and a stepper to diversify her indoor exercise routine. She plans to join a [employer] for family exercise sessions.  The patient expresses concern about the potential discontinuation of her medication by her insurance company. She is considering strategies to increase her exercise regimen in anticipation of this possibility. Despite these concerns, she reports feeling mentally sound and believes the combination of Wellbutrin  and Wegovy  has been beneficial in managing her food intake.          PHYSICAL EXAM:  Blood pressure 123/85, pulse 85, temperature 97.9 F (36.6 C), height 5' 8  (1.727 m), weight 180 lb (81.6 kg), last menstrual period 04/11/2023, SpO2 99%. Body mass index is 27.37 kg/m.  DIAGNOSTIC DATA REVIEWED:  BMET    Component Value Date/Time   NA 139 04/25/2023 0840   NA 138 02/18/2022 0742   K 3.9 04/25/2023 0840   CL 105 04/25/2023 0840   CO2 27 04/25/2023 0840   GLUCOSE 92 04/25/2023 0840   BUN 10 04/25/2023 0840   BUN 8 02/18/2022 0742   CREATININE 0.87 04/25/2023 0840   CALCIUM 8.9 04/25/2023 0840   GFRNONAA >60 12/03/2020 1649   GFRAA 124 05/29/2018 1049   Lab Results  Component Value Date   HGBA1C 5.0 12/22/2022   HGBA1C 5.5 01/18/2018   Lab Results  Component Value Date   INSULIN  5.7 12/22/2022   INSULIN  22.0 01/18/2018   Lab Results  Component Value Date   TSH 0.62 04/25/2023   CBC    Component Value Date/Time   WBC 3.9 (L) 04/25/2023 0840   RBC 4.43 04/25/2023 0840   HGB 13.7 04/25/2023 0840   HGB 13.9 05/20/2021 0935   HCT 39.4 04/25/2023 0840   HCT 40.3 05/20/2021 0935   PLT 209.0 04/25/2023 0840   PLT 209 05/20/2021 0935   MCV 89.0 04/25/2023 0840   MCV 87 05/20/2021 0935   MCH 29.9 05/20/2021 0935   MCH 30.2 12/03/2020 1649   MCHC 34.9 04/25/2023 0840   RDW 12.8 04/25/2023 0840   RDW 12.4 05/20/2021 0935   Iron Studies No results found for: IRON, TIBC, FERRITIN, IRONPCTSAT Lipid Panel  Component Value Date/Time   CHOL 180 04/25/2023 0840   CHOL 210 (H) 12/22/2022 1244   TRIG 117.0 04/25/2023 0840   HDL 48.00 04/25/2023 0840   HDL 52 12/22/2022 1244   CHOLHDL 4 04/25/2023 0840   VLDL 23.4 04/25/2023 0840   LDLCALC 108 (H) 04/25/2023 0840   LDLCALC 138 (H) 12/22/2022 1244   Hepatic Function Panel     Component Value Date/Time   PROT 6.8 04/25/2023 0840   PROT 6.7 02/18/2022 0742   ALBUMIN 4.1 04/25/2023 0840   ALBUMIN 4.2 02/18/2022 0742   AST 14 04/25/2023 0840   ALT 14 04/25/2023 0840   ALKPHOS 43 04/25/2023 0840   BILITOT 0.4 04/25/2023 0840   BILITOT 0.2 02/18/2022 0742       Component Value Date/Time   TSH 0.62 04/25/2023 0840   Nutritional Lab Results  Component Value Date   VD25OH 94.4 12/22/2022   VD25OH 80.8 02/18/2022   VD25OH 69.1 09/21/2021     Assessment and Plan    Obesity with polyphagia and Emotional eating behaviors Obesity with emotional eating behaviors. She has lost seven pounds over the last two months, following a category three eating plan 60% of the time and using a pedal bike for 30 minutes twice a week. Currently on Wellbutrin  SR 150 mg daily for emotional eating and Wegovy  1.7 mg weekly for polyphagia. Discussed potential discontinuation of Wegovy  by insurance and the auto-tapering effect lasting four weeks. She is concerned about abrupt discontinuation and its impact on progress. Incorporating more physical activity, including a walking pad treadmill and stepper, and plan to join the [employer] with her family. - Continue Wellbutrin  SR 150 mg daily - Continue Wegovy  1.7 mg weekly - Send in a refill for Wegovy  - Schedule follow-up appointment in six weeks to monitor progress and medication needs  General Health Maintenance Incorporating more physical activity, including a pedal bike, walking pad treadmill, and stepper. Plan to join the [employer] with her family. - Encourage continued physical activity - Support joining the [employer] for family exercise.          She was informed of the importance of frequent follow up visits to maximize her success with intensive lifestyle modifications for her multiple health conditions.    Louann Penton, MD

## 2023-05-19 ENCOUNTER — Other Ambulatory Visit (HOSPITAL_COMMUNITY): Payer: Self-pay

## 2023-06-04 ENCOUNTER — Other Ambulatory Visit (HOSPITAL_COMMUNITY): Payer: Self-pay

## 2023-06-04 ENCOUNTER — Other Ambulatory Visit (HOSPITAL_BASED_OUTPATIENT_CLINIC_OR_DEPARTMENT_OTHER): Payer: Self-pay

## 2023-06-09 ENCOUNTER — Other Ambulatory Visit (HOSPITAL_COMMUNITY): Payer: Self-pay

## 2023-06-09 MED ORDER — LO LOESTRIN FE 1 MG-10 MCG / 10 MCG PO TABS
1.0000 | ORAL_TABLET | Freq: Every day | ORAL | 3 refills | Status: DC
  Filled 2023-06-09: qty 28, 28d supply, fill #0

## 2023-06-10 LAB — HM PAP SMEAR: HPV, high-risk: NEGATIVE

## 2023-06-22 ENCOUNTER — Encounter: Payer: Self-pay | Admitting: Family

## 2023-06-22 ENCOUNTER — Other Ambulatory Visit: Payer: Self-pay

## 2023-06-22 DIAGNOSIS — E039 Hypothyroidism, unspecified: Secondary | ICD-10-CM

## 2023-06-22 MED ORDER — LEVOTHYROXINE SODIUM 150 MCG PO TABS: 150.0000 ug | ORAL_TABLET | Freq: Every day | ORAL | 1 refills | Status: DC

## 2023-07-06 ENCOUNTER — Other Ambulatory Visit (HOSPITAL_COMMUNITY): Payer: Self-pay

## 2023-07-06 ENCOUNTER — Encounter (INDEPENDENT_AMBULATORY_CARE_PROVIDER_SITE_OTHER): Payer: Self-pay | Admitting: Family Medicine

## 2023-07-06 ENCOUNTER — Ambulatory Visit (INDEPENDENT_AMBULATORY_CARE_PROVIDER_SITE_OTHER): Payer: 59 | Admitting: Family Medicine

## 2023-07-06 VITALS — BP 122/84 | HR 77 | Temp 97.9°F | Ht 68.0 in | Wt 178.0 lb

## 2023-07-06 DIAGNOSIS — F3289 Other specified depressive episodes: Secondary | ICD-10-CM

## 2023-07-06 DIAGNOSIS — Z6827 Body mass index (BMI) 27.0-27.9, adult: Secondary | ICD-10-CM

## 2023-07-06 DIAGNOSIS — E669 Obesity, unspecified: Secondary | ICD-10-CM

## 2023-07-06 DIAGNOSIS — R632 Polyphagia: Secondary | ICD-10-CM | POA: Diagnosis not present

## 2023-07-06 MED ORDER — BUPROPION HCL ER (SR) 150 MG PO TB12: 150.0000 mg | ORAL_TABLET | Freq: Every day | ORAL | 0 refills | Status: DC

## 2023-07-06 MED ORDER — WEGOVY 1.7 MG/0.75ML ~~LOC~~ SOAJ
1.7000 mg | SUBCUTANEOUS | 0 refills | Status: DC
  Filled 2023-07-06: qty 3, 28d supply, fill #0

## 2023-07-06 NOTE — Progress Notes (Signed)
 .smr  Office: [phone]  /  Fax: [phone]  WEIGHT SUMMARY AND BIOMETRICS  Anthropometric Measurements Height: 5\' 8"  (1.727 m) Weight: 178 lb (80.7 kg) BMI (Calculated): 27.07 Weight at Last Visit: 180 lb Weight Lost Since Last Visit: 2 lb Weight Gained Since Last Visit: 0 Total Weight Loss (lbs): 71 lb (32.2 kg)   Body Composition  Body Fat %: 32.6 % Fat Mass (lbs): 58.2 lbs Muscle Mass (lbs): 114.4 lbs Total Body Water (lbs): 79.6 lbs Visceral Fat Rating : 5   Other Clinical Data Fasting: No Labs: No Today's Visit #: 28    Chief Complaint: OBESITY    History of Present Illness   Cathy Vargas is a 38 year old female who presents to discuss her weight management.  She has lost two pounds in the last month since her last visit. She follows the category three eating plan about sixty percent of the time and exercises at the gym, primarily doing cardio for forty-five minutes, three to four times per week. She feels more hungry, especially earlier in the morning, which is a change from before when she could skip breakfast until lunch. She now feels the need to eat by 10 AM.  She is currently on [facility] for polyphasia and requests a refill. She also has a history of emotional eating behaviors and is on Wellbutrin SR 150 mg, for which she also requests a refill. Her hunger is reasonably well controlled and does not feel out of control. She mentions that she feels full quickly and has to be deliberate about consuming enough protein.  Her social history includes joining a gym, the [employer], [place], which is conveniently located near her home. She exercises with her husband and son, making it a family activity. She emphasizes the importance of being healthy rather than focusing on weight loss or appearance, especially in discussions with her son, who is also on medication Greggory Keen).          PHYSICAL EXAM:  Blood pressure 122/84, pulse 77, temperature 97.9 F  (36.6 C), height 5\' 8"  (1.727 m), weight 178 lb (80.7 kg), SpO2 100%. Body mass index is 27.06 kg/m.  DIAGNOSTIC DATA REVIEWED:  BMET    Component Value Date/Time   NA 139 04/25/2023 0840   NA 138 02/18/2022 0742   K 3.9 04/25/2023 0840   CL 105 04/25/2023 0840   CO2 27 04/25/2023 0840   GLUCOSE 92 04/25/2023 0840   BUN 10 04/25/2023 0840   BUN 8 02/18/2022 0742   CREATININE 0.87 04/25/2023 0840   CALCIUM 8.9 04/25/2023 0840   GFRNONAA >60 12/03/2020 1649   GFRAA 124 05/29/2018 1049   Lab Results  Component Value Date   HGBA1C 5.0 12/22/2022   HGBA1C 5.5 01/18/2018   Lab Results  Component Value Date   INSULIN 5.7 12/22/2022   INSULIN 22.0 01/18/2018   Lab Results  Component Value Date   TSH 0.62 04/25/2023   CBC    Component Value Date/Time   WBC 3.9 (L) 04/25/2023 0840   RBC 4.43 04/25/2023 0840   HGB 13.7 04/25/2023 0840   HGB 13.9 05/20/2021 0935   HCT 39.4 04/25/2023 0840   HCT 40.3 05/20/2021 0935   PLT 209.0 04/25/2023 0840   PLT 209 05/20/2021 0935   MCV 89.0 04/25/2023 0840   MCV 87 05/20/2021 0935   MCH 29.9 05/20/2021 0935   MCH 30.2 12/03/2020 1649   MCHC 34.9 04/25/2023 0840   RDW 12.8 04/25/2023 0840  RDW 12.4 05/20/2021 0935   Iron Studies No results found for: "IRON", "TIBC", "FERRITIN", "IRONPCTSAT" Lipid Panel     Component Value Date/Time   CHOL 180 04/25/2023 0840   CHOL 210 (H) 12/22/2022 1244   TRIG 117.0 04/25/2023 0840   HDL 48.00 04/25/2023 0840   HDL 52 12/22/2022 1244   CHOLHDL 4 04/25/2023 0840   VLDL 23.4 04/25/2023 0840   LDLCALC 108 (H) 04/25/2023 0840   LDLCALC 138 (H) 12/22/2022 1244   Hepatic Function Panel     Component Value Date/Time   PROT 6.8 04/25/2023 0840   PROT 6.7 02/18/2022 0742   ALBUMIN 4.1 04/25/2023 0840   ALBUMIN 4.2 02/18/2022 0742   AST 14 04/25/2023 0840   ALT 14 04/25/2023 0840   ALKPHOS 43 04/25/2023 0840   BILITOT 0.4 04/25/2023 0840   BILITOT 0.2 02/18/2022 0742       Component Value Date/Time   TSH 0.62 04/25/2023 0840   Nutritional Lab Results  Component Value Date   VD25OH 94.4 12/22/2022   VD25OH 80.8 02/18/2022   VD25OH 69.1 09/21/2021     Assessment and Plan    Obesity and Polyphagia Actively managing weight with a two-pound loss in the last month. Adheres to category three eating plan 60% of the time and performs cardio exercises 45 minutes, 3-4 times per week. Reports increased morning hunger, indicating improved insulin resistance and prediabetes control. On Wegovy for appetite control and requests a refill. Focuses on muscle mass building and overall fitness with family. Discussed protein intake importance and risks of malnutrition and sarcopenia if not managed properly. Emphasized balanced weight loss to avoid muscle wasting and metabolic slowdown. - Refill [id number] prescription at [facility] - Continue current exercise regimen and gradually increase intensity - Focus on protein intake to support muscle building - Consider seeking guidance on specific exercise routines in a few months  Emotional Eating Behaviors Well-managed with Wellbutrin SR 150 mg, effective in controlling cravings and emotional eating. Requests a refill. - Refill Wellbutrin SR 150 mg prescription at [facility] pharmacy  General Health Maintenance Improving overall health through diet, exercise, and medication management. Focused on setting a positive example for her son and breaking unhealthy generational habits related to body image and eating behaviors. Discussed the importance of maintaining a focus on health rather than weight or appearance to promote long-term well-being. - Continue emphasizing healthy eating and exercise habits - Encourage family participation in physical activities - Maintain a focus on health rather than weight or appearance.         She was informed of the importance of frequent follow up visits to maximize her success with  intensive lifestyle modifications for her multiple health conditions.    Quillian Quince, MD

## 2023-08-04 ENCOUNTER — Other Ambulatory Visit (HOSPITAL_COMMUNITY): Payer: Self-pay

## 2023-08-04 ENCOUNTER — Encounter (INDEPENDENT_AMBULATORY_CARE_PROVIDER_SITE_OTHER): Payer: Self-pay | Admitting: Family Medicine

## 2023-08-04 ENCOUNTER — Other Ambulatory Visit (INDEPENDENT_AMBULATORY_CARE_PROVIDER_SITE_OTHER): Payer: Self-pay | Admitting: Family Medicine

## 2023-08-04 DIAGNOSIS — R632 Polyphagia: Secondary | ICD-10-CM

## 2023-08-04 MED ORDER — WEGOVY 1.7 MG/0.75ML ~~LOC~~ SOAJ
1.7000 mg | SUBCUTANEOUS | 0 refills | Status: DC
  Filled 2023-08-04 (×2): qty 3, 28d supply, fill #0

## 2023-08-04 NOTE — Telephone Encounter (Signed)
 Ok to rf x 1

## 2023-08-05 ENCOUNTER — Other Ambulatory Visit (HOSPITAL_COMMUNITY): Payer: Self-pay

## 2023-08-08 ENCOUNTER — Other Ambulatory Visit: Payer: Self-pay

## 2023-08-08 ENCOUNTER — Other Ambulatory Visit (HOSPITAL_COMMUNITY): Payer: Self-pay

## 2023-08-15 ENCOUNTER — Encounter (INDEPENDENT_AMBULATORY_CARE_PROVIDER_SITE_OTHER): Payer: Self-pay | Admitting: Family Medicine

## 2023-08-15 ENCOUNTER — Ambulatory Visit (INDEPENDENT_AMBULATORY_CARE_PROVIDER_SITE_OTHER): Payer: 59 | Admitting: Family Medicine

## 2023-08-15 VITALS — BP 132/84 | HR 77 | Temp 98.7°F | Ht 68.0 in | Wt 179.0 lb

## 2023-08-15 DIAGNOSIS — Z6827 Body mass index (BMI) 27.0-27.9, adult: Secondary | ICD-10-CM

## 2023-08-15 DIAGNOSIS — E669 Obesity, unspecified: Secondary | ICD-10-CM

## 2023-08-15 DIAGNOSIS — R030 Elevated blood-pressure reading, without diagnosis of hypertension: Secondary | ICD-10-CM

## 2023-08-15 DIAGNOSIS — F439 Reaction to severe stress, unspecified: Secondary | ICD-10-CM

## 2023-08-15 DIAGNOSIS — F3289 Other specified depressive episodes: Secondary | ICD-10-CM

## 2023-08-15 DIAGNOSIS — R632 Polyphagia: Secondary | ICD-10-CM

## 2023-08-15 NOTE — Progress Notes (Signed)
 Office: [phone]  /  Fax: [phone]  WEIGHT SUMMARY AND BIOMETRICS  Anthropometric Measurements Height: 5\' 8"  (1.727 m) Weight: 179 lb (81.2 kg) BMI (Calculated): 27.22 Weight at Last Visit: 78 lb Weight Lost Since Last Visit: 0 Weight Gained Since Last Visit: 1 lb Starting Weight: 249 lb Total Weight Loss (lbs): 70 lb (31.8 kg)   Body Composition  Body Fat %: 31.6 % Fat Mass (lbs): 56.6 lbs Muscle Mass (lbs): 116.2 lbs Total Body Water (lbs): 81.4 lbs Visceral Fat Rating : 5   Other Clinical Data Fasting: no Labs: no Today's Visit #: 29 Starting Date: 05/20/21    Chief Complaint: OBESITY    History of Present Illness Cathy Vargas is a 38 year old female who presents to assess her progress with her treatment plan for obesity.  She is adhering to her category three eating plan approximately sixty percent of the time and engages in physical activity for thirty minutes, three times a week. Despite these efforts, she has experienced a weight gain of one pound over the past six weeks. She is currently on Wegovy to manage her polyphagia.  Her initial blood pressure reading was elevated at 144/89, which she attributes to stress, particularly related to work challenges, including a recent management change and increased workload. A repeat measurement showed a normalized reading of 133/84.  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 She is handling additional responsibilities due to a project director's absence, contributing to her stress levels. She recognizes that her high expectations for herself exacerbate her stress. Discussions with her husband about maintaining a work-life balance have occurred, and she acknowledges the benefit of therapy, although she is hesitant to start with a new therapist recommended by her  previous one.  Regarding physical activity, her husband often initiates gym visits, and she feels improved after exercising. She aims to make gym visits more regular, although it is not yet a daily routine. She exercises for about thirty minutes per session, finding it beneficial for her mental state.  She acknowledges the need to improve her breakfast routine, often skipping it due to time constraints. She plans to incorporate more convenient options like hard-boiled eggs or yogurt and intends to enhance her grocery planning to include breakfast items.      PHYSICAL EXAM:  Blood pressure 132/84, pulse 77, temperature 98.7 F (37.1 C), height 5\' 8"  (1.727 m), weight 179 lb (81.2 kg), SpO2 100%. Body mass index is 27.22 kg/m.  DIAGNOSTIC DATA REVIEWED:  BMET    Component Value Date/Time   NA 139 04/25/2023 0840   NA 138 02/18/2022 0742   K 3.9 04/25/2023 0840   CL 105 04/25/2023 0840   CO2 27 04/25/2023 0840   GLUCOSE 92 04/25/2023 0840   BUN 10 04/25/2023 0840   BUN 8 02/18/2022 0742   CREATININE 0.87 04/25/2023 0840   CALCIUM 8.9 04/25/2023 0840   GFRNONAA >60 12/03/2020 1649   GFRAA 124 05/29/2018 1049   Lab Results  Component Value Date   HGBA1C 5.0 12/22/2022   HGBA1C 5.5 01/18/2018   Lab Results  Component Value Date   INSULIN 5.7 12/22/2022   INSULIN 22.0 01/18/2018   Lab Results  Component Value Date   TSH 0.62 04/25/2023   CBC    Component Value Date/Time   WBC 3.9 (L) 04/25/2023 0840  RBC 4.43 04/25/2023 0840   HGB 13.7 04/25/2023 0840   HGB 13.9 05/20/2021 0935   HCT 39.4 04/25/2023 0840   HCT 40.3 05/20/2021 0935   PLT 209.0 04/25/2023 0840   PLT 209 05/20/2021 0935   MCV 89.0 04/25/2023 0840   MCV 87 05/20/2021 0935   MCH 29.9 05/20/2021 0935   MCH 30.2 12/03/2020 1649   MCHC 34.9 04/25/2023 0840   RDW 12.8 04/25/2023 0840   RDW 12.4 05/20/2021 0935   Iron Studies No results found for: "IRON", "TIBC", "FERRITIN", "IRONPCTSAT" Lipid  Panel     Component Value Date/Time   CHOL 180 04/25/2023 0840   CHOL 210 (H) 12/22/2022 1244   TRIG 117.0 04/25/2023 0840   HDL 48.00 04/25/2023 0840   HDL 52 12/22/2022 1244   CHOLHDL 4 04/25/2023 0840   VLDL 23.4 04/25/2023 0840   LDLCALC 108 (H) 04/25/2023 0840   LDLCALC 138 (H) 12/22/2022 1244   Hepatic Function Panel     Component Value Date/Time   PROT 6.8 04/25/2023 0840   PROT 6.7 02/18/2022 0742   ALBUMIN 4.1 04/25/2023 0840   ALBUMIN 4.2 02/18/2022 0742   AST 14 04/25/2023 0840   ALT 14 04/25/2023 0840   ALKPHOS 43 04/25/2023 0840   BILITOT 0.4 04/25/2023 0840   BILITOT 0.2 02/18/2022 0742      Component Value Date/Time   TSH 0.62 04/25/2023 0840   Nutritional Lab Results  Component Value Date   VD25OH 94.4 12/22/2022   VD25OH 80.8 02/18/2022   VD25OH 69.1 09/21/2021     Assessment and Plan Assessment & Plan Obesity She adheres to her category three eating plan approximately 60% of the time and exercises for 30 minutes three times a week. She gained one pound in the last six weeks. Her body fat percentage is 31%, visceral fat is 5, and muscle mass exceeds fat percentage, indicating a healthy weight. BMI is not a primary concern as her fat and muscle metrics are within healthy ranges. The current focus is on maintaining weight and muscle mass rather than further weight loss. - Continue current eating plan and exercise routine - Monitor weight and body composition metrics - Focus on maintaining muscle mass and healthy weight  Polyphagia She is on [facility] for polyphagia management, with two doses remaining and no refill needed currently. She encountered issues obtaining the medication due to insurance requirements for app engagement and weigh-ins but has set reminders to comply. Insurance may require reauthorization in July, with potential changes if her BMI falls below a certain threshold. Her current health metrics are satisfactory. - Ensure compliance with  insurance requirements for medication coverage - Monitor medication supply and refill as needed - Prepare for potential insurance reauthorization in July  Elevated blood pressure without history of HTN Initial blood pressure was elevated at 144/89, normalizing to 133/84 upon repeat measurement. She attributes the initial elevation to work-related stress. She is aware of the need to manage stress to control blood pressure. Regular monitoring and stress management are essential to prevent sustained hypertension. - Monitor blood pressure regularly - Address stress management to help control blood pressure  Stress She reports significant work-related stress due to changes in management and increased workload. She acknowledges the need for therapy to manage stress and work-life balance. She has not been in therapy for almost a year and is hesitant about seeing a new therapist recommended by her previous therapist. Therapy has previously helped with work stress and family relationships. - Encourage scheduling  an appointment with the recommended therapist - Consider therapy to manage stress and improve work-life balance - Explore strategies to improve work-life balance      She was informed of the importance of frequent follow up visits to maximize her success with intensive lifestyle modifications for her multiple health conditions.    Quillian Quince, MD

## 2023-08-29 ENCOUNTER — Encounter (INDEPENDENT_AMBULATORY_CARE_PROVIDER_SITE_OTHER): Payer: Self-pay | Admitting: Family Medicine

## 2023-08-29 ENCOUNTER — Other Ambulatory Visit (INDEPENDENT_AMBULATORY_CARE_PROVIDER_SITE_OTHER): Payer: Self-pay | Admitting: Family Medicine

## 2023-08-29 DIAGNOSIS — R632 Polyphagia: Secondary | ICD-10-CM

## 2023-08-30 ENCOUNTER — Other Ambulatory Visit (HOSPITAL_COMMUNITY): Payer: Self-pay

## 2023-08-30 MED ORDER — WEGOVY 1.7 MG/0.75ML ~~LOC~~ SOAJ
1.7000 mg | SUBCUTANEOUS | 0 refills | Status: DC
  Filled 2023-08-30: qty 3, 28d supply, fill #0

## 2023-08-30 NOTE — Telephone Encounter (Signed)
 Patient called in asking that her prescription for Wegovy  be refilled [facility]. Please follow up with the patient.

## 2023-09-19 ENCOUNTER — Ambulatory Visit: Payer: Self-pay

## 2023-09-19 NOTE — Telephone Encounter (Signed)
  Chief Complaint: Antibiotics Symptoms: urinary  Frequency:  Pertinent Negatives: Patient denies fever Disposition: [] ED /[] Urgent Care (no appt availability in office) / [x] Appointment(In office/virtual)/ []  [facility]/ [] Home Care/ [] Refused Recommended Disposition /[] [facility]/ []  Follow-up with PCP Additional Notes:  Home uti test is positive. Symptoms started 09/18/23. Only symptoms is urinary urgency with frequency. Requesting antibiotics to be called in. Advised acute visit, offered next available with PCP 09/20/23 1130 but she declines stating she has an GYN appointment tomorrow at 11:40, she was hoping to bypass appointment and just have antibiotics called in. She plans to keep GYN appointment to evaluate her urinary symptoms.   Copied from CRM [id number]. Topic: Clinical - Red Word Triage >> Sep 19, 2023 12:59 PM [employer] wrote: Red Word that prompted transfer to Nurse Triage: Positive home UTI test. Reason for Disposition  Urinating more frequently than usual (i.e., frequency)  Protocols used: Urinary Symptoms-A-AH

## 2023-09-20 ENCOUNTER — Other Ambulatory Visit (HOSPITAL_COMMUNITY): Payer: Self-pay

## 2023-09-26 ENCOUNTER — Encounter (INDEPENDENT_AMBULATORY_CARE_PROVIDER_SITE_OTHER): Payer: Self-pay | Admitting: Family Medicine

## 2023-09-26 ENCOUNTER — Other Ambulatory Visit (HOSPITAL_COMMUNITY): Payer: Self-pay

## 2023-09-26 ENCOUNTER — Other Ambulatory Visit (INDEPENDENT_AMBULATORY_CARE_PROVIDER_SITE_OTHER): Payer: Self-pay | Admitting: Family Medicine

## 2023-09-26 DIAGNOSIS — R632 Polyphagia: Secondary | ICD-10-CM

## 2023-09-26 MED ORDER — WEGOVY 1.7 MG/0.75ML ~~LOC~~ SOAJ: 1.7000 mg | SUBCUTANEOUS | 0 refills | Status: DC

## 2023-09-26 NOTE — Telephone Encounter (Signed)
 LAST APPOINTMENT DATE: 08/15/2023 NEXT APPOINTMENT DATE: 10/12/2023   [id number] [place], [place] - [address] [place] [address] Phone: [phone] Fax: [phone]  [facility] - [place], [place] - [address] [place] [address] Phone: [phone] Fax: [phone]  [id number] [place], [place] - [address] AT [facility] [address] [facility] [address] [place] [address] Phone: [phone] Fax: [phone]  [place] - [facility] [address] [place] [address] Phone: [phone] Fax: [phone]  Patient is requesting a refill of the following medications: Requested Prescriptions   Pending Prescriptions Disp Refills   Semaglutide -Weight Management (WEGOVY ) 1.7 MG/0.75ML SOAJ 3 mL 0    Sig: Inject 1.7 mg into the skin once a week.    Date last filled: 08/30/2023 Previously prescribed by Dr Alvia Awkward  Lab Results  Component Value Date   HGBA1C 5.0 12/22/2022   HGBA1C 5.3 02/18/2022   HGBA1C 5.3 05/20/2021   Lab Results  Component Value Date   LDLCALC 108 (H) 04/25/2023   CREATININE 0.87 04/25/2023   Lab Results  Component Value Date   VD25OH 94.4 12/22/2022   VD25OH 80.8 02/18/2022   VD25OH 69.1 09/21/2021    BP Readings from Last 3 Encounters:  08/15/23 132/84  07/06/23 122/84  05/18/23 123/85

## 2023-09-28 ENCOUNTER — Other Ambulatory Visit (HOSPITAL_COMMUNITY): Payer: Self-pay

## 2023-09-28 ENCOUNTER — Ambulatory Visit (INDEPENDENT_AMBULATORY_CARE_PROVIDER_SITE_OTHER): Admitting: Family Medicine

## 2023-09-28 MED ORDER — SEMAGLUTIDE-WEIGHT MANAGEMENT 1.7 MG/0.75ML ~~LOC~~ SOAJ
1.7000 mg | SUBCUTANEOUS | 0 refills | Status: DC
  Filled 2023-09-28: qty 3, 28d supply, fill #0

## 2023-10-12 ENCOUNTER — Other Ambulatory Visit (HOSPITAL_COMMUNITY): Payer: Self-pay

## 2023-10-12 ENCOUNTER — Encounter (INDEPENDENT_AMBULATORY_CARE_PROVIDER_SITE_OTHER): Payer: Self-pay | Admitting: Family Medicine

## 2023-10-12 ENCOUNTER — Ambulatory Visit (INDEPENDENT_AMBULATORY_CARE_PROVIDER_SITE_OTHER): Admitting: Family Medicine

## 2023-10-12 VITALS — BP 126/91 | HR 76 | Temp 98.1°F | Ht 68.0 in | Wt 178.0 lb

## 2023-10-12 DIAGNOSIS — F3289 Other specified depressive episodes: Secondary | ICD-10-CM

## 2023-10-12 DIAGNOSIS — E66812 Obesity, class 2: Secondary | ICD-10-CM

## 2023-10-12 DIAGNOSIS — R030 Elevated blood-pressure reading, without diagnosis of hypertension: Secondary | ICD-10-CM

## 2023-10-12 DIAGNOSIS — R632 Polyphagia: Secondary | ICD-10-CM

## 2023-10-12 DIAGNOSIS — I10 Essential (primary) hypertension: Secondary | ICD-10-CM | POA: Diagnosis not present

## 2023-10-12 DIAGNOSIS — Z6827 Body mass index (BMI) 27.0-27.9, adult: Secondary | ICD-10-CM | POA: Diagnosis not present

## 2023-10-12 DIAGNOSIS — E669 Obesity, unspecified: Secondary | ICD-10-CM | POA: Diagnosis not present

## 2023-10-12 MED ORDER — BUPROPION HCL ER (SR) 150 MG PO TB12: 150.0000 mg | ORAL_TABLET | Freq: Every day | ORAL | 0 refills | Status: DC

## 2023-10-12 MED ORDER — BUPROPION HCL ER (SR) 150 MG PO TB12
150.0000 mg | ORAL_TABLET | Freq: Every day | ORAL | 0 refills | Status: DC
  Filled 2023-10-12: qty 30, 30d supply, fill #0

## 2023-10-12 MED ORDER — WEGOVY 1.7 MG/0.75ML ~~LOC~~ SOAJ
1.7000 mg | SUBCUTANEOUS | 0 refills | Status: DC
Start: 2023-10-12 — End: 2023-11-14
  Filled 2023-10-12 – 2023-10-24 (×2): qty 3, 28d supply, fill #0
  Filled ????-??-??: fill #0

## 2023-10-12 NOTE — Progress Notes (Signed)
 Office: [phone]  /  Fax: [phone]  WEIGHT SUMMARY AND BIOMETRICS  Anthropometric Measurements Height: 5\' 8"  (1.727 m) Weight: 178 lb (80.7 kg) BMI (Calculated): 27.07 Weight at Last Visit: 179 lb Weight Lost Since Last Visit: 1 lb Weight Gained Since Last Visit: 0 Starting Weight: 249 lb Total Weight Loss (lbs): 71 lb (32.2 kg) Peak Weight: 260 lb   Body Composition  Body Fat %: 31.3 % Fat Mass (lbs): 55.8 lbs Muscle Mass (lbs): 116.2 lbs Total Body Water (lbs): 79.8 lbs Visceral Fat Rating : 5   Other Clinical Data Fasting: no Labs: no Today's Visit #: 30 Starting Date: 05/20/21    Chief Complaint: OBESITY   History of Present Illness Cathy Vargas is a 38 year old female who presents for obesity treatment and progress assessment.  She is adhering to a category three eating plan approximately sixty percent of the time and engages in physical activity at the gym for thirty minutes twice a week. Since her last visit, she has experienced a weight loss of one pound. She is actively working on incorporating more protein into her diet with the support of her husband.  She has a history of emotional eating behaviors and is currently taking Wellbutrin  SR, 150 mg daily, to manage these behaviors. Her sugar cravings have shown improvement since starting the medication, although they persist to some extent. Work-related stress poses a challenge in maintaining a consistent eating schedule, impacting her ability to consume balanced meals throughout the day.  She is on Wegovy , 1.7 mg weekly, for polyphagia. The medication effectively reduces excessive hunger, but at times results in a lack of hunger, complicating her ability to eat when necessary. She experiences stomach discomfort if she delays eating, which subsequently leads to an aversion to food.      PHYSICAL EXAM:  Blood pressure (!) 126/91, pulse 76, temperature 98.1 F (36.7 C), height 5\' 8"  (1.727 m), weight  178 lb (80.7 kg), SpO2 100%. Body mass index is 27.06 kg/m.  DIAGNOSTIC DATA REVIEWED:  BMET    Component Value Date/Time   NA 139 04/25/2023 0840   NA 138 02/18/2022 0742   K 3.9 04/25/2023 0840   CL 105 04/25/2023 0840   CO2 27 04/25/2023 0840   GLUCOSE 92 04/25/2023 0840   BUN 10 04/25/2023 0840   BUN 8 02/18/2022 0742   CREATININE 0.87 04/25/2023 0840   CALCIUM 8.9 04/25/2023 0840   GFRNONAA >60 12/03/2020 1649   GFRAA 124 05/29/2018 1049   Lab Results  Component Value Date   HGBA1C 5.0 12/22/2022   HGBA1C 5.5 01/18/2018   Lab Results  Component Value Date   INSULIN  5.7 12/22/2022   INSULIN  22.0 01/18/2018   Lab Results  Component Value Date   TSH 0.62 04/25/2023   CBC    Component Value Date/Time   WBC 3.9 (L) 04/25/2023 0840   RBC 4.43 04/25/2023 0840   HGB 13.7 04/25/2023 0840   HGB 13.9 05/20/2021 0935   HCT 39.4 04/25/2023 0840   HCT 40.3 05/20/2021 0935   PLT 209.0 04/25/2023 0840   PLT 209 05/20/2021 0935   MCV 89.0 04/25/2023 0840   MCV 87 05/20/2021 0935   MCH 29.9 05/20/2021 0935   MCH 30.2 12/03/2020 1649   MCHC 34.9 04/25/2023 0840   RDW 12.8 04/25/2023 0840   RDW 12.4 05/20/2021 0935   Iron Studies No results found for: "IRON", "TIBC", "FERRITIN", "IRONPCTSAT" Lipid Panel     Component Value Date/Time  CHOL 180 04/25/2023 0840   CHOL 210 (H) 12/22/2022 1244   TRIG 117.0 04/25/2023 0840   HDL 48.00 04/25/2023 0840   HDL 52 12/22/2022 1244   CHOLHDL 4 04/25/2023 0840   VLDL 23.4 04/25/2023 0840   LDLCALC 108 (H) 04/25/2023 0840   LDLCALC 138 (H) 12/22/2022 1244   Hepatic Function Panel     Component Value Date/Time   PROT 6.8 04/25/2023 0840   PROT 6.7 02/18/2022 0742   ALBUMIN 4.1 04/25/2023 0840   ALBUMIN 4.2 02/18/2022 0742   AST 14 04/25/2023 0840   ALT 14 04/25/2023 0840   ALKPHOS 43 04/25/2023 0840   BILITOT 0.4 04/25/2023 0840   BILITOT 0.2 02/18/2022 0742      Component Value Date/Time   TSH 0.62 04/25/2023  0840   Nutritional Lab Results  Component Value Date   VD25OH 94.4 12/22/2022   VD25OH 80.8 02/18/2022   VD25OH 69.1 09/21/2021     Assessment and Plan Assessment & Plan Obesity and Polyphagia She is on a weight loss journey, adhering to a category three eating plan approximately 60% of the time. She exercises at the gym for 30 minutes twice weekly and has lost one pound since her last visit. Emotional eating behaviors and polyphagia are managed with Wellbutrin  SR and Wegovy , respectively. Reports improved sugar cravings but struggles with protein intake and meal planning due to work stress. Discussed the importance of real food over protein supplements and the potential for burnout with repetitive meal choices. Wegovy  helps decrease excessive hunger but may reduce all hunger, complicating meal timing. She and her husband are on a similar weight loss journey, providing mutual support. - Continue category three eating plan - Continue exercise regimen - Refill Wellbutrin  SR 150 mg daily - Refill Wegovy  1.7 mg weekly - Encourage meal planning with variety - Emphasize importance of real food over protein supplements  Hypertension Blood pressure is mildly elevated at 126/91 mmHg. She is not currently on antihypertensive medications. - Continue to work on diet and weight loss, will recheck BP in 1 month and if still elevated may need to start medications  Follow-up She will follow up in six weeks to assess progress with weight loss and management of her conditions. - Schedule follow-up appointment in six weeks    She was informed of the importance of frequent follow up visits to maximize her success with intensive lifestyle modifications for her multiple health conditions.    Jasmine Mesi, MD

## 2023-10-24 ENCOUNTER — Other Ambulatory Visit (HOSPITAL_COMMUNITY): Payer: Self-pay

## 2023-11-14 ENCOUNTER — Other Ambulatory Visit (INDEPENDENT_AMBULATORY_CARE_PROVIDER_SITE_OTHER): Payer: Self-pay | Admitting: Family Medicine

## 2023-11-14 DIAGNOSIS — R632 Polyphagia: Secondary | ICD-10-CM

## 2023-11-14 NOTE — Telephone Encounter (Signed)
 LAST APPOINTMENT DATE: 10/12/2023 NEXT APPOINTMENT DATE: 12/01/2023   [id number] [place], [place] - [address] [place] [address] Phone: [phone] Fax: [phone]  [facility] - [place], [place] - [address] [place] [address] Phone: [phone] Fax: [phone]  [id number] [place], [place] - [address] AT [facility] [address] [facility] [address] [place] [address] Phone: [phone] Fax: [phone]  [place] - [facility] [address] [place] [address] Phone: [phone] Fax: [phone]  Patient is requesting a refill of the following medications: Requested Prescriptions   Pending Prescriptions Disp Refills   Semaglutide -Weight Management (WEGOVY ) 1.7 MG/0.75ML SOAJ 3 mL 0    Sig: Inject 1.7 mg into the skin once a week.    Date last filled: 10/12/2023 Previously prescribed by Dr Verdon  Lab Results  Component Value Date   HGBA1C 5.0 12/22/2022   HGBA1C 5.3 02/18/2022   HGBA1C 5.3 05/20/2021   Lab Results  Component Value Date   LDLCALC 108 (H) 04/25/2023   CREATININE 0.87 04/25/2023   Lab Results  Component Value Date   VD25OH 94.4 12/22/2022   VD25OH 80.8 02/18/2022   VD25OH 69.1 09/21/2021    BP Readings from Last 3 Encounters:  10/12/23 (!) 126/91  08/15/23 132/84  07/06/23 122/84

## 2023-11-16 ENCOUNTER — Other Ambulatory Visit (HOSPITAL_COMMUNITY): Payer: Self-pay

## 2023-11-16 MED ORDER — WEGOVY 1.7 MG/0.75ML ~~LOC~~ SOAJ
1.7000 mg | SUBCUTANEOUS | 0 refills | Status: DC
Start: 2023-11-16 — End: 2023-12-01
  Filled 2023-11-16: qty 3, 28d supply, fill #0

## 2023-12-01 ENCOUNTER — Other Ambulatory Visit (HOSPITAL_COMMUNITY): Payer: Self-pay

## 2023-12-01 ENCOUNTER — Ambulatory Visit (INDEPENDENT_AMBULATORY_CARE_PROVIDER_SITE_OTHER): Admitting: Family Medicine

## 2023-12-01 ENCOUNTER — Encounter (INDEPENDENT_AMBULATORY_CARE_PROVIDER_SITE_OTHER): Payer: Self-pay | Admitting: Family Medicine

## 2023-12-01 VITALS — BP 128/90 | HR 76 | Temp 98.2°F | Ht 68.0 in | Wt 181.0 lb

## 2023-12-01 DIAGNOSIS — E669 Obesity, unspecified: Secondary | ICD-10-CM

## 2023-12-01 DIAGNOSIS — F5089 Other specified eating disorder: Secondary | ICD-10-CM | POA: Diagnosis not present

## 2023-12-01 DIAGNOSIS — F3289 Other specified depressive episodes: Secondary | ICD-10-CM

## 2023-12-01 DIAGNOSIS — Z6827 Body mass index (BMI) 27.0-27.9, adult: Secondary | ICD-10-CM

## 2023-12-01 DIAGNOSIS — R632 Polyphagia: Secondary | ICD-10-CM

## 2023-12-01 MED ORDER — WEGOVY 1.7 MG/0.75ML ~~LOC~~ SOAJ
1.7000 mg | SUBCUTANEOUS | 0 refills | Status: DC
  Filled 2023-12-01 – 2023-12-24 (×4): qty 3, 28d supply, fill #0
  Filled 2024-01-10 – 2024-01-16 (×3): qty 3, 28d supply, fill #1

## 2023-12-01 MED ORDER — BUPROPION HCL ER (SR) 150 MG PO TB12: 150.0000 mg | ORAL_TABLET | Freq: Every day | ORAL | 0 refills | Status: DC

## 2023-12-01 NOTE — Progress Notes (Signed)
 Office: [phone]  /  Fax: [phone]  WEIGHT SUMMARY AND BIOMETRICS  Anthropometric Measurements Height: 5' 8 (1.727 m) Weight: 181 lb (82.1 kg) BMI (Calculated): 27.53 Weight at Last Visit: 178 lb Weight Lost Since Last Visit: 0 Weight Gained Since Last Visit: 3 lb Starting Weight: 249 lb Total Weight Loss (lbs): 68 lb (30.8 kg) Peak Weight: 260 lb   Body Composition  Body Fat %: 30.3 % Fat Mass (lbs): 55 lbs Muscle Mass (lbs): 119.8 lbs Total Body Water (lbs): 80.2 lbs Visceral Fat Rating : 5   Other Clinical Data Fasting: yes Labs: no Today's Visit #: 31 Starting Date: 05/20/21    Chief Complaint: OBESITY   Discussed the use of AI scribe software for clinical note transcription with the patient, who gave verbal consent to proceed.  History of Present Illness Cathy Vargas is a 38 year old female who presents for obesity treatment management.  She has been following the category three eating plan about forty percent of the time and exercises for thirty to forty minutes, two times a week. Despite these efforts, she has gained three pounds in the last six weeks since her last visit.  The past month has been challenging due to disruptions in her normal schedule. Her son was out of town, and upon his return, her mother-in-law visited for two weeks, during which time she had more indulgent meals, including a lot of rice. Following this, she went to [place]  for almost a week to visit family, further disrupting her routine. She did not stay on her eating plan much during this period.  Since returning from [place] , she and her husband have been trying to get back on track by eliminating sugar from her home and resuming regular gym visits. She has been diligent about meal planning and prepping, using AI tools to assist with recipe ideas and nutritional information.  She is currently taking Wegovy  and Wellbutrin , with no reported issues.      PHYSICAL  EXAM:  Blood pressure (!) 128/90, pulse 76, temperature 98.2 F (36.8 C), height 5' 8 (1.727 m), weight 181 lb (82.1 kg), SpO2 96%. Body mass index is 27.52 kg/m.  DIAGNOSTIC DATA REVIEWED:  BMET    Component Value Date/Time   NA 139 04/25/2023 0840   NA 138 02/18/2022 0742   K 3.9 04/25/2023 0840   CL 105 04/25/2023 0840   CO2 27 04/25/2023 0840   GLUCOSE 92 04/25/2023 0840   BUN 10 04/25/2023 0840   BUN 8 02/18/2022 0742   CREATININE 0.87 04/25/2023 0840   CALCIUM 8.9 04/25/2023 0840   GFRNONAA >60 12/03/2020 1649   GFRAA 124 05/29/2018 1049   Lab Results  Component Value Date   HGBA1C 5.0 12/22/2022   HGBA1C 5.5 01/18/2018   Lab Results  Component Value Date   INSULIN  5.7 12/22/2022   INSULIN  22.0 01/18/2018   Lab Results  Component Value Date   TSH 0.62 04/25/2023   CBC    Component Value Date/Time   WBC 3.9 (L) 04/25/2023 0840   RBC 4.43 04/25/2023 0840   HGB 13.7 04/25/2023 0840   HGB 13.9 05/20/2021 0935   HCT 39.4 04/25/2023 0840   HCT 40.3 05/20/2021 0935   PLT 209.0 04/25/2023 0840   PLT 209 05/20/2021 0935   MCV 89.0 04/25/2023 0840   MCV 87 05/20/2021 0935   MCH 29.9 05/20/2021 0935   MCH 30.2 12/03/2020 1649   MCHC 34.9 04/25/2023 0840   RDW 12.8 04/25/2023 0840  RDW 12.4 05/20/2021 0935   Iron Studies No results found for: IRON, TIBC, FERRITIN, IRONPCTSAT Lipid Panel     Component Value Date/Time   CHOL 180 04/25/2023 0840   CHOL 210 (H) 12/22/2022 1244   TRIG 117.0 04/25/2023 0840   HDL 48.00 04/25/2023 0840   HDL 52 12/22/2022 1244   CHOLHDL 4 04/25/2023 0840   VLDL 23.4 04/25/2023 0840   LDLCALC 108 (H) 04/25/2023 0840   LDLCALC 138 (H) 12/22/2022 1244   Hepatic Function Panel     Component Value Date/Time   PROT 6.8 04/25/2023 0840   PROT 6.7 02/18/2022 0742   ALBUMIN 4.1 04/25/2023 0840   ALBUMIN 4.2 02/18/2022 0742   AST 14 04/25/2023 0840   ALT 14 04/25/2023 0840   ALKPHOS 43 04/25/2023 0840   BILITOT  0.4 04/25/2023 0840   BILITOT 0.2 02/18/2022 0742      Component Value Date/Time   TSH 0.62 04/25/2023 0840   Nutritional Lab Results  Component Value Date   VD25OH 94.4 12/22/2022   VD25OH 80.8 02/18/2022   VD25OH 69.1 09/21/2021     Assessment and Plan Assessment & Plan Obesity She has gained three pounds in the last six weeks, adhering to the prescribed category three eating plan 40% of the time and exercising 30-40 minutes twice a week. Recent family visits and travel have disrupted her routine, leading to dietary indulgences. She is motivated to return to her routine and has resumed gym attendance with her husband. Currently on Wegovy  for weight management, the importance of incorporating strength training to complement cardio workouts was discussed, as weight loss can reduce the challenge to muscles during exercise. - Prescribe Wegovy  1.7 mg for 90 days - Encourage adherence to the eating plan and regular exercise - Suggest incorporating strength training exercises to complement cardio workouts - Advise using gym equipment for a more complete workout if available - Discuss the importance of challenging muscles as weight decreases  Emotional Eating Behaviors Well-managed on Wellbutrin , which helps control her emotional eating behaviors. - Prescribe Wellbutrin  with refills as needed  General Health Maintenance She is utilizing AI tools for meal planning and grocery shopping to support her dietary goals, focusing on meal prepping and planning. - Encourage continued use of AI tools for meal planning and grocery shopping - Advise on meal prepping and planning to maintain dietary goals     She was informed of the importance of frequent follow up visits to maximize her success with intensive lifestyle modifications for her multiple health conditions.    Louann Penton, MD

## 2023-12-13 ENCOUNTER — Other Ambulatory Visit (HOSPITAL_COMMUNITY): Payer: Self-pay

## 2023-12-14 ENCOUNTER — Other Ambulatory Visit (HOSPITAL_COMMUNITY): Payer: Self-pay

## 2023-12-14 ENCOUNTER — Encounter (INDEPENDENT_AMBULATORY_CARE_PROVIDER_SITE_OTHER): Payer: Self-pay | Admitting: Family Medicine

## 2023-12-15 ENCOUNTER — Telehealth (INDEPENDENT_AMBULATORY_CARE_PROVIDER_SITE_OTHER): Payer: Self-pay

## 2023-12-15 NOTE — Telephone Encounter (Signed)
 Cathy Vargas (Key: [id number]) Wegovy  1.7MG /0.75ML auto-injectors

## 2023-12-15 NOTE — Telephone Encounter (Signed)
 The pt called in stating that [facility] needs a new prior authorization for Wegovy  but it needs to be called in. The number is [phone]. Please follow up with pt.

## 2023-12-15 NOTE — Telephone Encounter (Signed)
 PA was sent and waiting for determination.

## 2023-12-20 ENCOUNTER — Telehealth (INDEPENDENT_AMBULATORY_CARE_PROVIDER_SITE_OTHER): Payer: Self-pay | Admitting: Family Medicine

## 2023-12-20 NOTE — Telephone Encounter (Signed)
 8/12 pt called and said Prior shara was denied and could a appeal be filed

## 2023-12-21 NOTE — Telephone Encounter (Signed)
 Please see telephone message from 12/14/2023.

## 2023-12-23 ENCOUNTER — Other Ambulatory Visit: Payer: Self-pay | Admitting: Family

## 2023-12-23 ENCOUNTER — Other Ambulatory Visit (HOSPITAL_COMMUNITY): Payer: Self-pay

## 2023-12-23 DIAGNOSIS — E039 Hypothyroidism, unspecified: Secondary | ICD-10-CM

## 2023-12-24 ENCOUNTER — Other Ambulatory Visit (HOSPITAL_COMMUNITY): Payer: Self-pay

## 2024-01-05 NOTE — Telephone Encounter (Signed)
 New fax from [facility]:  Wegovy  1.7 mg has been approved from 12/21/2023 until 01/03/2025.

## 2024-01-10 ENCOUNTER — Other Ambulatory Visit (HOSPITAL_COMMUNITY): Payer: Self-pay

## 2024-01-12 ENCOUNTER — Other Ambulatory Visit (HOSPITAL_COMMUNITY): Payer: Self-pay

## 2024-01-12 ENCOUNTER — Encounter (INDEPENDENT_AMBULATORY_CARE_PROVIDER_SITE_OTHER): Payer: Self-pay | Admitting: Family Medicine

## 2024-01-12 ENCOUNTER — Ambulatory Visit (INDEPENDENT_AMBULATORY_CARE_PROVIDER_SITE_OTHER): Admitting: Family Medicine

## 2024-01-12 VITALS — BP 136/85 | HR 74 | Temp 98.0°F | Ht 68.0 in | Wt 179.0 lb

## 2024-01-12 DIAGNOSIS — F5089 Other specified eating disorder: Secondary | ICD-10-CM | POA: Diagnosis not present

## 2024-01-12 DIAGNOSIS — R632 Polyphagia: Secondary | ICD-10-CM | POA: Diagnosis not present

## 2024-01-12 DIAGNOSIS — Z6827 Body mass index (BMI) 27.0-27.9, adult: Secondary | ICD-10-CM | POA: Diagnosis not present

## 2024-01-12 DIAGNOSIS — E669 Obesity, unspecified: Secondary | ICD-10-CM | POA: Diagnosis not present

## 2024-01-12 DIAGNOSIS — F3289 Other specified depressive episodes: Secondary | ICD-10-CM

## 2024-01-12 MED ORDER — WEGOVY 1.7 MG/0.75ML ~~LOC~~ SOAJ
1.7000 mg | SUBCUTANEOUS | 0 refills | Status: DC
  Filled 2024-01-12: qty 6, 56d supply, fill #0
  Filled 2024-01-13 – 2024-01-14 (×2): qty 3, 28d supply, fill #0
  Filled 2024-02-09: qty 3, 28d supply, fill #1

## 2024-01-12 NOTE — Progress Notes (Signed)
 Office: [phone]  /  Fax: [phone]  WEIGHT SUMMARY AND BIOMETRICS  Anthropometric Measurements Height: 5' 8 (1.727 m) Weight: 179 lb (81.2 kg) BMI (Calculated): 27.22 Weight at Last Visit: 181 lb Weight Lost Since Last Visit: 2 lb Weight Gained Since Last Visit: 0 Starting Weight: 249 lb Total Weight Loss (lbs): 70 lb (31.8 kg) Peak Weight: 260 lb   Body Composition  Body Fat %: 32.2 % Fat Mass (lbs): 57.8 lbs Muscle Mass (lbs): 115.4 lbs Total Body Water (lbs): 80.8 lbs Visceral Fat Rating : 5   Other Clinical Data Fasting: no Labs: no Today's Visit #: 32 Starting Date: 05/20/21    Chief Complaint: OBESITY    History of Present Illness Cathy Vargas is a 38 year old female with obesity and depression who presents for obesity treatment and progress assessment.  She has been following the category three eating plan about fifty percent of the time and is working on increasing her intake of fruits and vegetables. She aims to sleep seven to nine hours per night but struggles with meeting her protein and water intake goals and occasionally skips meals. She exercises two days a week for thirty minutes, doing cardio at the gym. She has lost two pounds in the last six weeks.  She reports that her mood and emotional eating behaviors have been stable while taking Wellbutrin  SR 150 mg daily. Additionally, she is being treated for polyphagia with Wegovy  1.7 mg once a week.  She discusses challenges with protein intake and is considering tracking her protein and calorie intake to better meet her nutritional goals. Her husband is supportive in meal planning, and she focuses on being conscious about her food choices, particularly watching carbohydrate intake. She is interested in increasing her protein intake, particularly in the morning, and is exploring different recipes and meal options to achieve this.      PHYSICAL EXAM:  Blood pressure 136/85, pulse 74,  temperature 98 F (36.7 C), height 5' 8 (1.727 m), weight 179 lb (81.2 kg), SpO2 100%. Body mass index is 27.22 kg/m.  DIAGNOSTIC DATA REVIEWED:  BMET    Component Value Date/Time   NA 139 04/25/2023 0840   NA 138 02/18/2022 0742   K 3.9 04/25/2023 0840   CL 105 04/25/2023 0840   CO2 27 04/25/2023 0840   GLUCOSE 92 04/25/2023 0840   BUN 10 04/25/2023 0840   BUN 8 02/18/2022 0742   CREATININE 0.87 04/25/2023 0840   CALCIUM 8.9 04/25/2023 0840   GFRNONAA >60 12/03/2020 1649   GFRAA 124 05/29/2018 1049   Lab Results  Component Value Date   HGBA1C 5.0 12/22/2022   HGBA1C 5.5 01/18/2018   Lab Results  Component Value Date   INSULIN  5.7 12/22/2022   INSULIN  22.0 01/18/2018   Lab Results  Component Value Date   TSH 0.62 04/25/2023   CBC    Component Value Date/Time   WBC 3.9 (L) 04/25/2023 0840   RBC 4.43 04/25/2023 0840   HGB 13.7 04/25/2023 0840   HGB 13.9 05/20/2021 0935   HCT 39.4 04/25/2023 0840   HCT 40.3 05/20/2021 0935   PLT 209.0 04/25/2023 0840   PLT 209 05/20/2021 0935   MCV 89.0 04/25/2023 0840   MCV 87 05/20/2021 0935   MCH 29.9 05/20/2021 0935   MCH 30.2 12/03/2020 1649   MCHC 34.9 04/25/2023 0840   RDW 12.8 04/25/2023 0840   RDW 12.4 05/20/2021 0935   Iron Studies No results found for: IRON, TIBC,  FERRITIN, IRONPCTSAT Lipid Panel     Component Value Date/Time   CHOL 180 04/25/2023 0840   CHOL 210 (H) 12/22/2022 1244   TRIG 117.0 04/25/2023 0840   HDL 48.00 04/25/2023 0840   HDL 52 12/22/2022 1244   CHOLHDL 4 04/25/2023 0840   VLDL 23.4 04/25/2023 0840   LDLCALC 108 (H) 04/25/2023 0840   LDLCALC 138 (H) 12/22/2022 1244   Hepatic Function Panel     Component Value Date/Time   PROT 6.8 04/25/2023 0840   PROT 6.7 02/18/2022 0742   ALBUMIN 4.1 04/25/2023 0840   ALBUMIN 4.2 02/18/2022 0742   AST 14 04/25/2023 0840   ALT 14 04/25/2023 0840   ALKPHOS 43 04/25/2023 0840   BILITOT 0.4 04/25/2023 0840   BILITOT 0.2 02/18/2022  0742      Component Value Date/Time   TSH 0.62 04/25/2023 0840   Nutritional Lab Results  Component Value Date   VD25OH 94.4 12/22/2022   VD25OH 80.8 02/18/2022   VD25OH 69.1 09/21/2021     Assessment and Plan Assessment & Plan Obesity Obesity management with a category three eating plan, followed about 50% of the time. Progress includes a 2-pound weight loss over the last six weeks. Challenges include meeting protein and water intake goals, occasional meal skipping, and limited exercise (cardio twice a week for 30 minutes). - Continue category three eating plan - Increase protein intake to 85 grams per day or more - Maintain calorie intake between 1300-1600 - Encourage front-loading protein intake, aiming for 30 grams at breakfast - Continue current exercise regimen of cardio twice a week for 30 minutes  Emotional eating behaviors Emotional eating behaviors is well-managed on Wellbutrin  SR 150 mg daily, no SE noted, feels it is helping - Refill Wellbutrin  SR 150 mg daily  Polyphagia Polyphagia is being managed with Wegovy  1.7 mg once a week. Discussion on the pharmacy's refill process and prior authorization requirements for Wegovy . Advised on early refill requests to avoid delays, as the pharmacy allows refills a few days before the medication is due. No SE noted - Refill Wegovy  1.7 mg once a week - Advise on early refill requests to avoid delays  Follow-Up Follow-up appointments scheduled to monitor progress and adjust treatment as necessary. - Schedule follow-up appointment on February 27, 2024, at 4:20 PM - Schedule subsequent follow-up appointment on April 02, 2024, at 3:20 PM     Aniayah was informed of the importance of frequent follow up visits to maximize her success with intensive lifestyle modifications for her obesity and obesity related health conditions as recommended by USPSTF and CMS guidelines   Louann Penton, MD

## 2024-01-13 ENCOUNTER — Other Ambulatory Visit (HOSPITAL_COMMUNITY): Payer: Self-pay

## 2024-01-13 ENCOUNTER — Other Ambulatory Visit: Payer: Self-pay

## 2024-01-14 ENCOUNTER — Other Ambulatory Visit (HOSPITAL_COMMUNITY): Payer: Self-pay

## 2024-01-16 ENCOUNTER — Other Ambulatory Visit: Payer: Self-pay

## 2024-01-18 ENCOUNTER — Other Ambulatory Visit (HOSPITAL_COMMUNITY): Payer: Self-pay

## 2024-01-19 ENCOUNTER — Other Ambulatory Visit (HOSPITAL_COMMUNITY): Payer: Self-pay

## 2024-02-27 ENCOUNTER — Encounter (INDEPENDENT_AMBULATORY_CARE_PROVIDER_SITE_OTHER): Payer: Self-pay | Admitting: Family Medicine

## 2024-02-27 ENCOUNTER — Ambulatory Visit (INDEPENDENT_AMBULATORY_CARE_PROVIDER_SITE_OTHER): Admitting: Family Medicine

## 2024-02-27 ENCOUNTER — Other Ambulatory Visit (HOSPITAL_COMMUNITY): Payer: Self-pay

## 2024-02-27 VITALS — BP 134/92 | HR 73 | Temp 98.9°F | Ht 68.0 in | Wt 177.0 lb

## 2024-02-27 DIAGNOSIS — F5089 Other specified eating disorder: Secondary | ICD-10-CM | POA: Diagnosis not present

## 2024-02-27 DIAGNOSIS — R632 Polyphagia: Secondary | ICD-10-CM | POA: Diagnosis not present

## 2024-02-27 DIAGNOSIS — Z6827 Body mass index (BMI) 27.0-27.9, adult: Secondary | ICD-10-CM

## 2024-02-27 DIAGNOSIS — E66812 Obesity, class 2: Secondary | ICD-10-CM

## 2024-02-27 DIAGNOSIS — E538 Deficiency of other specified B group vitamins: Secondary | ICD-10-CM

## 2024-02-27 DIAGNOSIS — F3289 Other specified depressive episodes: Secondary | ICD-10-CM

## 2024-02-27 DIAGNOSIS — E559 Vitamin D deficiency, unspecified: Secondary | ICD-10-CM

## 2024-02-27 DIAGNOSIS — F32A Depression, unspecified: Secondary | ICD-10-CM

## 2024-02-27 DIAGNOSIS — E88819 Insulin resistance, unspecified: Secondary | ICD-10-CM

## 2024-02-27 DIAGNOSIS — Z6826 Body mass index (BMI) 26.0-26.9, adult: Secondary | ICD-10-CM

## 2024-02-27 MED ORDER — BUPROPION HCL ER (SR) 150 MG PO TB12: 150.0000 mg | ORAL_TABLET | Freq: Every day | ORAL | 0 refills | Status: DC

## 2024-02-27 MED ORDER — WEGOVY 1.7 MG/0.75ML ~~LOC~~ SOAJ
1.7000 mg | SUBCUTANEOUS | 0 refills | Status: DC
  Filled 2024-02-27 – 2024-02-29 (×2): qty 6, 56d supply, fill #0
  Filled 2024-03-06: qty 3, 28d supply, fill #0
  Filled 2024-03-30: qty 3, 28d supply, fill #1

## 2024-02-27 NOTE — Progress Notes (Signed)
 Office: [phone]  /  Fax: [phone]  WEIGHT SUMMARY AND BIOMETRICS  Anthropometric Measurements Height: 5' 8 (1.727 m) Weight: 177 lb (80.3 kg) BMI (Calculated): 26.92 Weight at Last Visit: 179 lb Weight Lost Since Last Visit: 2 lb Weight Gained Since Last Visit: 0 Starting Weight: 249 lb Total Weight Loss (lbs): 72 lb (32.7 kg) Peak Weight: 260 lb   Body Composition  Body Fat %: 31.2 % Fat Mass (lbs): 55.4 lbs Muscle Mass (lbs): 115.8 lbs Total Body Water (lbs): 81 lbs Visceral Fat Rating : 5   Other Clinical Data Fasting: no Labs: no Today's Visit #: 33 Starting Date: 05/20/21    Chief Complaint: OBESITY   History of Present Illness Cathy Vargas is a 38 year old female who presents for obesity treatment and progress assessment.  She follows a category three eating plan about fifty percent of the time, attempting to increase her intake of fruits and vegetables but not consistently meeting protein goals. She struggles with adequate hydration and occasionally skips meals. Physical activity includes going to the gym once a week for thirty minutes, primarily doing cardio. She reports a weight loss of two pounds over the past month.  She is currently on Wegovy  at a dose of 1.7 mg, taken every Monday, with no issues reported. She requests a refill. Additionally, she is on Wellbutrin  SR 150 mg daily for emotional eating behaviors, which she feels has helped with sugar cravings and 'food noise'. She requests a refill of this medication as well.  Her blood pressure readings during the visit were elevated at 137/90 and 134/92. She does not have a diagnosis of hypertension and is not on any antihypertensive medications. She feels more stressed than usual due to work, which she believes may be contributing to her elevated blood pressure. She mentions a history of borderline elevated blood pressures since July 2024, with some readings showing the bottom number slightly  elevated.  She gets seven hours of sleep most nights and describes herself as a 'sound sleeper'. However, work stress has been high, with increased hours and weekend work due to a year-long project wrapping up.  Socially, she recently celebrated her son's birthday and plans to visit family in [place]. She enjoys trips to the mountains and has been making monthly visits. She has a supportive husband who drives during trips and helps with household activities like cat sitting.      PHYSICAL EXAM:  Blood pressure (!) 134/92, pulse 73, temperature 98.9 F (37.2 C), height 5' 8 (1.727 m), weight 177 lb (80.3 kg), SpO2 99%. Body mass index is 26.91 kg/m.  DIAGNOSTIC DATA REVIEWED:  BMET    Component Value Date/Time   NA 139 04/25/2023 0840   NA 138 02/18/2022 0742   K 3.9 04/25/2023 0840   CL 105 04/25/2023 0840   CO2 27 04/25/2023 0840   GLUCOSE 92 04/25/2023 0840   BUN 10 04/25/2023 0840   BUN 8 02/18/2022 0742   CREATININE 0.87 04/25/2023 0840   CALCIUM 8.9 04/25/2023 0840   GFRNONAA >60 12/03/2020 1649   GFRAA 124 05/29/2018 1049   Lab Results  Component Value Date   HGBA1C 5.0 12/22/2022   HGBA1C 5.5 01/18/2018   Lab Results  Component Value Date   INSULIN  5.7 12/22/2022   INSULIN  22.0 01/18/2018   Lab Results  Component Value Date   TSH 0.62 04/25/2023   CBC    Component Value Date/Time   WBC 3.9 (L) 04/25/2023 0840  RBC 4.43 04/25/2023 0840   HGB 13.7 04/25/2023 0840   HGB 13.9 05/20/2021 0935   HCT 39.4 04/25/2023 0840   HCT 40.3 05/20/2021 0935   PLT 209.0 04/25/2023 0840   PLT 209 05/20/2021 0935   MCV 89.0 04/25/2023 0840   MCV 87 05/20/2021 0935   MCH 29.9 05/20/2021 0935   MCH 30.2 12/03/2020 1649   MCHC 34.9 04/25/2023 0840   RDW 12.8 04/25/2023 0840   RDW 12.4 05/20/2021 0935   Iron Studies No results found for: IRON, TIBC, FERRITIN, IRONPCTSAT Lipid Panel     Component Value Date/Time   CHOL 180 04/25/2023 0840   CHOL 210  (H) 12/22/2022 1244   TRIG 117.0 04/25/2023 0840   HDL 48.00 04/25/2023 0840   HDL 52 12/22/2022 1244   CHOLHDL 4 04/25/2023 0840   VLDL 23.4 04/25/2023 0840   LDLCALC 108 (H) 04/25/2023 0840   LDLCALC 138 (H) 12/22/2022 1244   Hepatic Function Panel     Component Value Date/Time   PROT 6.8 04/25/2023 0840   PROT 6.7 02/18/2022 0742   ALBUMIN 4.1 04/25/2023 0840   ALBUMIN 4.2 02/18/2022 0742   AST 14 04/25/2023 0840   ALT 14 04/25/2023 0840   ALKPHOS 43 04/25/2023 0840   BILITOT 0.4 04/25/2023 0840   BILITOT 0.2 02/18/2022 0742      Component Value Date/Time   TSH 0.62 04/25/2023 0840   Nutritional Lab Results  Component Value Date   VD25OH 94.4 12/22/2022   VD25OH 80.8 02/18/2022   VD25OH 69.1 09/21/2021     Assessment and Plan Assessment & Plan Obesity and Polyphagia Class 2 obesity managed with Wegovy  and lifestyle modifications. She is following the category three eating plan about 50% of the time, increasing fruit and vegetable intake, but struggles with protein goals and hydration. She skips meals occasionally and exercises once a week for 30 minutes of cardio. She has lost 2 pounds in the last month. No issues reported with Wegovy . - Continue Wegovy  1.7 mg weekly - Encouraged adherence to category three eating plan - Encouraged increased protein intake and hydration - Encouraged regular exercise  Elevated Blood Pressure, Monitoring for Hypertension Blood pressure is elevated at 137/90 and 134/92. She is not on antihypertensives and does not have a diagnosis of hypertension. Stress and inadequate hydration may contribute to elevated readings. Discussed potential kidney damage from uncontrolled blood pressure and the importance of monitoring. Explained that elevated blood pressure can lead to kidney damage over time, as the cells in the kidneys are one cell thick and can be scarred by high pressure. - Check blood pressure at home weekly - Bring home blood pressure  readings to next visit - Continue lifestyle modifications including decreasing sodium, increasing exercise, and weight loss  Depression with Emotional Eating Behaviors Depression with emotional eating behaviors managed with Wellbutrin  SR 150 mg daily. She reports good tolerance and improvement in emotional eating behaviors and sugar cravings. - Continue Wellbutrin  SR 150 mg daily - Refilled Wellbutrin  SR prescription  Will order labs for insulin  resistance, HLD, Vit D deficiency and B12 deficiency and discuss results at follow up appointment in 1 month   Olga was informed of the importance of frequent follow up visits to maximize her success with intensive lifestyle modifications for her obesity and obesity related health conditions as recommended by USPSTF and CMS guidelines   Louann Penton, MD

## 2024-02-29 ENCOUNTER — Other Ambulatory Visit (HOSPITAL_COMMUNITY): Payer: Self-pay

## 2024-03-07 ENCOUNTER — Other Ambulatory Visit (HOSPITAL_COMMUNITY): Payer: Self-pay

## 2024-03-07 ENCOUNTER — Other Ambulatory Visit: Payer: Self-pay

## 2024-03-21 LAB — VITAMIN B12: Vitamin B-12: 435 pg/mL (ref 232–1245)

## 2024-03-21 LAB — LIPID PANEL+APOB
Apolipoprotein B: 102 mg/dL — ABNORMAL HIGH (ref <90)
Cholesterol, Total: 213 mg/dL — ABNORMAL HIGH (ref 100–199)
HDL-C: 70 mg/dL (ref >39)
LDL-C (NIH Calc): 132 mg/dL — ABNORMAL HIGH (ref 0–99)
Non-HDL Cholesterol: 143 mg/dL — ABNORMAL HIGH (ref 0–129)
Triglycerides: 60 mg/dL (ref 0–149)

## 2024-03-21 LAB — CMP14+EGFR
ALT: 11 IU/L (ref 0–32)
AST: 15 IU/L (ref 0–40)
Albumin: 4.2 g/dL (ref 3.9–4.9)
Alkaline Phosphatase: 43 IU/L (ref 41–116)
BUN/Creatinine Ratio: 15 (ref 9–23)
BUN: 12 mg/dL (ref 6–24)
Bilirubin Total: 0.4 mg/dL (ref 0.0–1.2)
CO2: 21 mmol/L (ref 20–29)
Calcium: 9 mg/dL (ref 8.7–10.2)
Chloride: 104 mmol/L (ref 96–106)
Creatinine, Ser: 0.81 mg/dL (ref 0.57–1.00)
Globulin, Total: 2.1 g/dL (ref 1.5–4.5)
Glucose: 84 mg/dL (ref 70–99)
Potassium: 4.1 mmol/L (ref 3.5–5.2)
Sodium: 140 mmol/L (ref 134–144)
Total Protein: 6.3 g/dL (ref 6.0–8.5)
eGFR: 94 mL/min/1.73 (ref >59)

## 2024-03-21 LAB — VITAMIN D 25 HYDROXY (VIT D DEFICIENCY, FRACTURES): Vit D, 25-Hydroxy: 40.7 ng/mL (ref 30.0–100.0)

## 2024-03-21 LAB — LIPOPROTEIN A (LPA): Lipoprotein (a): 8.7 nmol/L (ref <75.0)

## 2024-03-21 LAB — HEMOGLOBIN A1C
Est. average glucose Bld gHb Est-mCnc: 88 mg/dL
Hgb A1c MFr Bld: 4.7 % — ABNORMAL LOW (ref 4.8–5.6)

## 2024-03-21 LAB — INSULIN, RANDOM: INSULIN: 5.1 u[IU]/mL (ref 2.6–24.9)

## 2024-03-21 LAB — T4, FREE: Free T4: 1.23 ng/dL (ref 0.82–1.77)

## 2024-03-21 LAB — T3: T3, Total: 107 ng/dL (ref 71–180)

## 2024-03-21 LAB — TSH: TSH: 3.28 u[IU]/mL (ref 0.450–4.500)

## 2024-04-02 ENCOUNTER — Other Ambulatory Visit (HOSPITAL_COMMUNITY): Payer: Self-pay

## 2024-04-02 ENCOUNTER — Ambulatory Visit (INDEPENDENT_AMBULATORY_CARE_PROVIDER_SITE_OTHER): Payer: Self-pay | Admitting: Family Medicine

## 2024-04-02 ENCOUNTER — Encounter (INDEPENDENT_AMBULATORY_CARE_PROVIDER_SITE_OTHER): Payer: Self-pay | Admitting: Family Medicine

## 2024-04-02 VITALS — BP 122/83 | HR 80 | Temp 97.9°F | Ht 68.0 in | Wt 177.0 lb

## 2024-04-02 DIAGNOSIS — Z6827 Body mass index (BMI) 27.0-27.9, adult: Secondary | ICD-10-CM

## 2024-04-02 DIAGNOSIS — Z6837 Body mass index (BMI) 37.0-37.9, adult: Secondary | ICD-10-CM

## 2024-04-02 DIAGNOSIS — F5089 Other specified eating disorder: Secondary | ICD-10-CM | POA: Diagnosis not present

## 2024-04-02 DIAGNOSIS — Z6826 Body mass index (BMI) 26.0-26.9, adult: Secondary | ICD-10-CM

## 2024-04-02 DIAGNOSIS — R632 Polyphagia: Secondary | ICD-10-CM

## 2024-04-02 DIAGNOSIS — E7800 Pure hypercholesterolemia, unspecified: Secondary | ICD-10-CM

## 2024-04-02 DIAGNOSIS — E782 Mixed hyperlipidemia: Secondary | ICD-10-CM

## 2024-04-02 DIAGNOSIS — F3289 Other specified depressive episodes: Secondary | ICD-10-CM

## 2024-04-02 DIAGNOSIS — E559 Vitamin D deficiency, unspecified: Secondary | ICD-10-CM | POA: Diagnosis not present

## 2024-04-02 DIAGNOSIS — E669 Obesity, unspecified: Secondary | ICD-10-CM

## 2024-04-02 MED ORDER — WEGOVY 1.7 MG/0.75ML ~~LOC~~ SOAJ
1.7000 mg | SUBCUTANEOUS | 0 refills | Status: DC
  Filled 2024-04-02: qty 6, 56d supply, fill #0
  Filled 2024-05-05: qty 3, 28d supply, fill #0
  Filled 2024-05-05: qty 6, 56d supply, fill #0
  Filled 2024-05-07: qty 3, 28d supply, fill #0

## 2024-04-02 MED ORDER — BUPROPION HCL ER (SR) 150 MG PO TB12: 150.0000 mg | ORAL_TABLET | Freq: Every day | ORAL | 0 refills | Status: AC

## 2024-04-02 NOTE — Progress Notes (Signed)
 Office: [phone]  /  Fax: [phone]  WEIGHT SUMMARY AND BIOMETRICS  Anthropometric Measurements Height: 5' 8 (1.727 m) Weight: 177 lb (80.3 kg) BMI (Calculated): 26.92 Weight at Last Visit: 177 lb Weight Lost Since Last Visit: 0 Weight Gained Since Last Visit: 0 Starting Weight: 249 lb Total Weight Loss (lbs): 72 lb (32.7 kg) Peak Weight: 260 lb   Body Composition  Body Fat %: 32.3 % Fat Mass (lbs): 57.4 lbs Muscle Mass (lbs): 114.4 lbs Total Body Water (lbs): 81 lbs Visceral Fat Rating : 5   Other Clinical Data Fasting: no Labs: no Today's Visit #: 34 Starting Date: 05/20/21    Chief Complaint: OBESITY   History of Present Illness Cathy Vargas is a 38 year old female with obesity and emotional eating behaviors who presents for obesity treatment plan assessment and progress evaluation.  She has been adhering to a category three eating plan about fifty percent of the time and is not currently engaging in physical exercise. Her weight has remained stable over the past month since her last visit. She is preparing to host Thanksgiving with friends and family.  She is diagnosed with emotional eating behaviors and is currently on Wellbutrin  SR 150 mg daily. She is actively working on recognizing and avoiding emotional eating triggers and requests a refill of her medication. There have been no changes in her medication regimen.  She is also on Wegovy  1.7 mg for polyphagia, a separate diagnosis from her obesity. She is focusing on increasing protein intake and reducing carbohydrates to help manage excessive hunger and requests a refill of Wegovy .  No symptoms of hypoglycemia such as feeling hot, weak, or shaky. She occasionally experiences dizziness when standing up quickly in the early morning, which she attributes to dehydration due to insufficient water intake. Recent lab results show an A1c of 4.7, indicating stable blood sugar levels.  Her cholesterol levels  include an LDL of 132, triglycerides of 60, and HDL of 70. She has a preference for fried foods and is considering dietary changes to manage her LDL levels. She has been off vitamin D  supplements. She previously had a low calcium test score.      PHYSICAL EXAM:  Blood pressure 122/83, pulse 80, temperature 97.9 F (36.6 C), height 5' 8 (1.727 m), weight 177 lb (80.3 kg), SpO2 99%. Body mass index is 26.91 kg/m.  DIAGNOSTIC DATA REVIEWED:  BMET    Component Value Date/Time   NA 140 03/19/2024 0749   K 4.1 03/19/2024 0749   CL 104 03/19/2024 0749   CO2 21 03/19/2024 0749   GLUCOSE 84 03/19/2024 0749   GLUCOSE 92 04/25/2023 0840   BUN 12 03/19/2024 0749   CREATININE 0.81 03/19/2024 0749   CALCIUM 9.0 03/19/2024 0749   GFRNONAA >60 12/03/2020 1649   GFRAA 124 05/29/2018 1049   Lab Results  Component Value Date   HGBA1C 4.7 (L) 03/19/2024   HGBA1C 5.5 01/18/2018   Lab Results  Component Value Date   INSULIN  5.1 03/19/2024   INSULIN  22.0 01/18/2018   Lab Results  Component Value Date   TSH 3.280 03/19/2024   CBC    Component Value Date/Time   WBC 3.9 (L) 04/25/2023 0840   RBC 4.43 04/25/2023 0840   HGB 13.7 04/25/2023 0840   HGB 13.9 05/20/2021 0935   HCT 39.4 04/25/2023 0840   HCT 40.3 05/20/2021 0935   PLT 209.0 04/25/2023 0840   PLT 209 05/20/2021 0935   MCV 89.0 04/25/2023 0840  MCV 87 05/20/2021 0935   MCH 29.9 05/20/2021 0935   MCH 30.2 12/03/2020 1649   MCHC 34.9 04/25/2023 0840   RDW 12.8 04/25/2023 0840   RDW 12.4 05/20/2021 0935   Iron Studies No results found for: IRON, TIBC, FERRITIN, IRONPCTSAT Lipid Panel     Component Value Date/Time   CHOL 180 04/25/2023 0840   CHOL 210 (H) 12/22/2022 1244   TRIG 117.0 04/25/2023 0840   HDL 48.00 04/25/2023 0840   HDL 52 12/22/2022 1244   CHOLHDL 4 04/25/2023 0840   VLDL 23.4 04/25/2023 0840   LDLCALC 108 (H) 04/25/2023 0840   LDLCALC 138 (H) 12/22/2022 1244   Hepatic Function Panel      Component Value Date/Time   PROT 6.3 03/19/2024 0749   ALBUMIN 4.2 03/19/2024 0749   AST 15 03/19/2024 0749   ALT 11 03/19/2024 0749   ALKPHOS 43 03/19/2024 0749   BILITOT 0.4 03/19/2024 0749      Component Value Date/Time   TSH 3.280 03/19/2024 0749   Nutritional Lab Results  Component Value Date   VD25OH 40.7 03/19/2024   VD25OH 94.4 12/22/2022   VD25OH 80.8 02/18/2022     Assessment and Plan Assessment & Plan Obesity and polyphagia Obesity management is ongoing with a category three eating plan, followed about 50% of the time. No current exercise regimen. Weight has been maintained over the last month. Polyphagia is managed with Wegovy  1.7 mg, with efforts to increase protein intake and reduce carbohydrates to control hunger. No adverse effects reported from Wegovy . A1c is 4.7, indicating good blood sugar control without hypoglycemia. Occasional dizziness upon standing quickly, likely due to dehydration. - Continue Wegovy  1.7 mg for polyphagia. - Encouraged increased protein intake and reduced carbohydrate consumption. - Advised on hydration to address dizziness upon standing.  Emotional eating behaviors  Emotional eating behaviors are being addressed with Wellbutrin  SR 150 mg daily. She is working on recognizing and avoiding these behaviors. No changes in symptoms reported. - Refilled Wellbutrin  SR 150 mg daily.  Elevated LDL cholesterol LDL cholesterol is elevated at 132 mg/dL. Triglycerides and HDL are within normal limits. Apolipoprotein B is slightly elevated, and lipoprotein A is normal. Genetic factors contribute significantly to LDL levels. Current lifestyle modifications are effective, but further intervention may be needed if LDL remains elevated in the future. Statins may be considered if LDL is not at goal in the next five years. - Continue current lifestyle modifications. - Monitor LDL levels and consider medication if not at goal in the next five  years.  Vitamin D  deficiency Vitamin D  levels have decreased since discontinuation of supplementation. Previous levels were 94 ng/mL. She prefers daily supplementation over bi-weekly prescription. - Start over-the-counter vitamin D  2000 IU daily.      Patients who are on anti-obesity medications are counseled on the importance of maintaining healthy lifestyle habits, including balanced nutrition, regular physical activity, and behavioral modifications,  Medication is an adjunct to, not a replacement for, lifestyle changes and that the long-term success and weight maintenance depend on continued adherence to these strategies.   Leeandra was informed of the importance of frequent follow up visits to maximize her success with intensive lifestyle modifications for her obesity and obesity related health conditions as recommended by USPSTF and CMS guidelines  Louann Penton, MD

## 2024-04-25 ENCOUNTER — Ambulatory Visit (INDEPENDENT_AMBULATORY_CARE_PROVIDER_SITE_OTHER): Payer: 59 | Admitting: Family

## 2024-04-25 ENCOUNTER — Encounter: Payer: Self-pay | Admitting: Family

## 2024-04-25 VITALS — BP 118/74 | HR 80 | Temp 97.0°F | Ht 68.0 in | Wt 180.4 lb

## 2024-04-25 DIAGNOSIS — E039 Hypothyroidism, unspecified: Secondary | ICD-10-CM | POA: Diagnosis not present

## 2024-04-25 DIAGNOSIS — Z1211 Encounter for screening for malignant neoplasm of colon: Secondary | ICD-10-CM

## 2024-04-25 DIAGNOSIS — Z0000 Encounter for general adult medical examination without abnormal findings: Secondary | ICD-10-CM | POA: Diagnosis not present

## 2024-04-25 MED ORDER — LEVOTHYROXINE SODIUM 150 MCG PO TABS: 150.0000 ug | ORAL_TABLET | Freq: Every day | ORAL | 3 refills | Status: AC

## 2024-04-25 NOTE — Progress Notes (Signed)
 Phone [phone]  Subjective:   Patient is a 38 y.o. female presenting for annual physical.    Chief Complaint  Patient presents with   Annual Exam    Fasting w/ labs  Discussed the use of AI scribe software for clinical note transcription with the patient, who gave verbal consent to proceed.  History of Present Illness Cathy Vargas is a 38 year old female who presents for weight and wellness follow-up.  She has maintained her weight loss and feels satisfied with her progress. She recently joined a gym and does about 30 minutes of cardio on the elliptical or Stairmaster a few times per week. She has a sister with early-onset colon cancer and is considering scheduling a colonoscopy. She works from home. Recent labs included A1c, lipids, CMP, thyroid , and vitamin D . She takes vitamin D  2000 IU daily after prior dose adjustment for high levels. Current medications include low-dose birth control, Wegovy  1.7 mg, and bupropion  150 mcg. Birth control has regulated her menstrual cycles. She drinks alcohol rarely, about once a month, and notes vomiting after a single drink since the birth of her son, so she avoids beer and usually chooses wine. She has no current symptoms of anemia and reports regular menses on birth control.  See problem oriented charting- ROS- full  review of systems was completed and negative except for what is noted in HPI above.  The following were reviewed and entered/updated in epic: Past Medical History:  Diagnosis Date   Abnormal Pap smear of cervix 2015   Anxiety    Back pain    BMI 31.0-31.9,adult 07/01/2022   Chest pain    Class 2 severe obesity with serious comorbidity and body mass index (BMI) of 37.0 to 37.9 in adult 02/18/2022   Constipation    Gallbladder disease    Gallbladder problem    HTN (hypertension)    Hyperlipemia    Hypothyroidism    Joint pain    Joint pain    PCOS (polycystic ovarian syndrome)    Right Achilles tendinitis 10/29/2020    Sciatica    Thyroid  disease    Vitamin D  deficiency    Patient Active Problem List   Diagnosis Date Noted   Gastroesophageal reflux disease 02/16/2023   BMI 28.0-28.9,adult 02/16/2023   Other secondary hypertension 11/09/2022   Polyphagia 09/23/2022   Elevated blood pressure reading without diagnosis of hypertension 09/23/2022   Depression 09/23/2022   Obesity, Beginning BMI 37.71 06/02/2022   Vitamin D  deficiency 02/18/2022   Insulin  resistance 02/18/2022   Mixed hyperlipidemia 02/18/2022   Acquired hypothyroidism 04/20/2021   Past Surgical History:  Procedure Laterality Date   CHOLECYSTECTOMY  2010   DILATION AND CURETTAGE OF UTERUS  2015   OOPHORECTOMY     WISDOM TOOTH EXTRACTION  2001    Family History  Problem Relation Age of Onset   Obesity Mother    Obesity Father    Hyperlipidemia Father    Alcoholism Father    Drug abuse Brother    Stroke Maternal Grandmother    Heart disease Maternal Grandfather    Cancer Paternal Grandfather     Medications- reviewed and updated Current Outpatient Medications  Medication Sig Dispense Refill   buPROPion  (WELLBUTRIN  SR) 150 MG 12 hr tablet Take 1 tablet (150 mg) by mouth daily. 90 tablet 0   JUNEL FE 1/20 1-20 MG-MCG tablet Take 1 tablet by mouth daily.     semaglutide -weight management (WEGOVY ) 1.7 MG/0.75ML SOAJ SQ injection Inject 1.7 mg  into the skin once a week. 6 mL 0   levothyroxine  (SYNTHROID ) 150 MCG tablet Take 1 tablet (150 mcg total) by mouth daily before breakfast. 90 tablet 3   No current facility-administered medications for this visit.    Allergies-reviewed and updated Allergies[1]  Social History   Social History Narrative   Not on file    Objective:  BP 118/74 (BP Location: Left Arm, Patient Position: Sitting, Cuff Size: Normal)   Pulse 80   Temp (!) 97 F (36.1 C) (Temporal)   Ht 5' 8 (1.727 m)   Wt 180 lb 6.4 oz (81.8 kg)   LMP 04/11/2024 (Approximate)   SpO2 99%   BMI 27.43 kg/m   Physical Exam Vitals and nursing note reviewed.  Constitutional:      Appearance: Normal appearance.  HENT:     Head: Normocephalic.     Right Ear: Tympanic membrane normal.     Left Ear: Tympanic membrane normal.     Nose: Nose normal.     Mouth/Throat:     Mouth: Mucous membranes are moist.  Eyes:     Pupils: Pupils are equal, round, and reactive to light.  Cardiovascular:     Rate and Rhythm: Normal rate and regular rhythm.  Pulmonary:     Effort: Pulmonary effort is normal.     Breath sounds: Normal breath sounds.  Musculoskeletal:        General: Normal range of motion.     Cervical back: Normal range of motion.  Lymphadenopathy:     Cervical: No cervical adenopathy.  Skin:    General: Skin is warm and dry.  Neurological:     Mental Status: She is alert.  Psychiatric:        Mood and Affect: Mood normal.        Behavior: Behavior normal.      Assessment and Plan   Health Maintenance counseling: 1. Anticipatory guidance: Patient counseled regarding regular dental exams q6 months, eye exams,  avoiding smoking and second hand smoke, limiting alcohol to 1 beverage per day, no illicit drugs.   2. Risk factor reduction:  Advised patient of need for regular exercise and diet rich with fruits and vegetables to reduce risk of heart attack and stroke.  Wt Readings from Last 3 Encounters:  04/25/24 180 lb 6.4 oz (81.8 kg)  04/02/24 177 lb (80.3 kg)  02/27/24 177 lb (80.3 kg)   3. Immunizations/screenings/ancillary studies Immunization History  Administered Date(s) Administered   PFIZER(Purple Top)SARS-COV-2 Vaccination 08/24/2019, 09/14/2019, 04/18/2020   Td 10/10/2017   Health Maintenance Due  Topic Date Due   Hepatitis B Vaccines 19-59 Average Risk (1 of 3 - 19+ 3-dose series) Never done   HPV VACCINES (1 - 3-dose SCDM series) Never done    4. Cervical cancer screening- 06/2023, requesting records 5. Breast cancer screening-  mammogram done this year 6. Colon  cancer screening - never, sister with hx colon cancer, ordering today 7. Skin cancer screening- advised regular sunscreen use. Denies worrisome, changing, or new skin lesions.  8. Birth control/STD check-  OCP 9. Osteoporosis screening-  N/A 10. Alcohol screening:  rare 11. Smoking associated screening (lung cancer screening, AAA screen 65-75, UA)-  non- smoker 12. Exercise-  working out 2 days per week   Assessment & Plan Acquired hypothyroidism Well-managed with stable thyroid  function on current dose of Levothyroxine  150mcg qd. - Continue current thyroid  medication regimen, refill sent. - F/U in 1 year  Weight management and wellness Weight management  progressing well with exercise and medication. Discussed exercise benefits for disease prevention and stress reduction. Alcohol intake is rare due to adverse reactions. - Continue current exercise routine, consider increasing to five days or more a week. - Incorporate weightlifting or bodyweight exercises. - Continue Wegovy  1.7 mg for weight management.  General Health Maintenance Due for colonoscopy due to family history of colon cancer. Discussed procedure details. Recent labs completed; no anemia concern. Declined flu shot, advised on illness prevention. - Sent referral for colonoscopy. - Continue vitamin D  supplementation at 2000 IU daily. - Consider zinc and vitamin C if feeling unwell or as preventative for travel. - Use hand sanitizer and maintain hygiene during travel.  Recommended follow up:  Return for any future concerns, Complete physical w/fasting labs. Future Appointments  Date Time Provider Department Center  05/14/2024  4:20 PM Verdon Louann BIRCH, MD [facility] None  06/11/2024  4:00 PM Verdon Louann BIRCH, MD [facility] None  04/29/2025  8:30 AM Lucius Krabbe, NP [facility]    Lab/Order associations:fasting   Lucius Krabbe, NP      [1] No Known Allergies

## 2024-04-25 NOTE — Patient Instructions (Addendum)
 It was very nice to see you today!   I will review your lab results via MyChart in a few days.  You look great! Stay well! Keep exercising!  Have a wonderful Christmas :-)    PLEASE NOTE:  If you had any lab tests please let us  know if you have not heard back within a few days. You may see your results on MyChart before we have a chance to review them but we will give you a call once they are reviewed by us . If we ordered any referrals today, please let us  know if you have not heard from their office within the next week.

## 2024-05-05 ENCOUNTER — Other Ambulatory Visit (HOSPITAL_COMMUNITY): Payer: Self-pay

## 2024-05-05 ENCOUNTER — Telehealth (HOSPITAL_COMMUNITY): Payer: Self-pay

## 2024-05-05 NOTE — Telephone Encounter (Signed)
 Medication: wegovy  1.7mg  Able to fill? No Prior authorization required? Yes Co-pay before assistance: n/a

## 2024-05-07 ENCOUNTER — Other Ambulatory Visit (HOSPITAL_COMMUNITY): Payer: Self-pay

## 2024-05-07 ENCOUNTER — Telehealth (INDEPENDENT_AMBULATORY_CARE_PROVIDER_SITE_OTHER): Payer: Self-pay

## 2024-05-07 NOTE — Telephone Encounter (Signed)
 Sent patient message

## 2024-05-07 NOTE — Telephone Encounter (Signed)
No PA needed at this time ?

## 2024-05-14 ENCOUNTER — Ambulatory Visit (INDEPENDENT_AMBULATORY_CARE_PROVIDER_SITE_OTHER): Payer: Self-pay | Admitting: Family Medicine

## 2024-06-05 ENCOUNTER — Encounter (INDEPENDENT_AMBULATORY_CARE_PROVIDER_SITE_OTHER): Payer: Self-pay | Admitting: Family Medicine

## 2024-06-06 ENCOUNTER — Other Ambulatory Visit (HOSPITAL_COMMUNITY): Payer: Self-pay

## 2024-06-06 ENCOUNTER — Telehealth (INDEPENDENT_AMBULATORY_CARE_PROVIDER_SITE_OTHER): Admitting: Family Medicine

## 2024-06-06 ENCOUNTER — Encounter (INDEPENDENT_AMBULATORY_CARE_PROVIDER_SITE_OTHER): Payer: Self-pay | Admitting: Family Medicine

## 2024-06-06 VITALS — Ht 68.0 in | Wt 177.0 lb

## 2024-06-06 DIAGNOSIS — Z6826 Body mass index (BMI) 26.0-26.9, adult: Secondary | ICD-10-CM | POA: Diagnosis not present

## 2024-06-06 DIAGNOSIS — Z6827 Body mass index (BMI) 27.0-27.9, adult: Secondary | ICD-10-CM

## 2024-06-06 DIAGNOSIS — E669 Obesity, unspecified: Secondary | ICD-10-CM

## 2024-06-06 DIAGNOSIS — F3289 Other specified depressive episodes: Secondary | ICD-10-CM

## 2024-06-06 DIAGNOSIS — F5089 Other specified eating disorder: Secondary | ICD-10-CM

## 2024-06-06 DIAGNOSIS — R632 Polyphagia: Secondary | ICD-10-CM

## 2024-06-06 MED ORDER — WEGOVY 1.7 MG/0.75ML ~~LOC~~ SOAJ
1.7000 mg | SUBCUTANEOUS | 0 refills | Status: AC
  Filled 2024-06-06: qty 3, 28d supply, fill #0

## 2024-06-06 NOTE — Progress Notes (Signed)
 "  Office: [phone]  /  Fax: [phone]  WEIGHT SUMMARY AND BIOMETRICS  Anthropometric Measurements Height: 5' 8 (1.727 m) Weight: 177 lb (80.3 kg) (last ov) BMI (Calculated): 26.92 Weight at Last Visit: 177 lb Starting Weight: 249 lb Peak Weight: 260 lb   No data recorded Other Clinical Data Fasting: no Labs: no Today's Visit #: 35 Starting Date: 05/20/21 Comments: my chart video visit    Chief Complaint: OBESITY  Virtual Visit via A/V Note  I connected with Cathy Vargas on 06/06/24 at 12:40 PM EST by audiovisual telehealth and verified that I am speaking with the correct person using two identifiers.  Location: Patient: home Provider: clinic   I discussed the limitations, risks, security and privacy concerns of performing an evaluation and management service by AV telehealth and the availability of in person appointments. I also discussed with the patient that there may be a patient responsible charge related to this service. The patient expressed understanding and agreed to proceed.     History of Present Illness  Cathy Vargas is a 39 year old female who presents with obesity management.  She has been adhering to the prescribed category three eating plan about fifty percent of the time. She weighed herself at home and reports a weight of 174 pounds, noting a perceived weight loss since her last visit on April 02, 2024.  She engages in physical activity by performing cardio at the gym two days a week for thirty minutes each session. She is attempting to improve her diet by increasing her intake of fruits and vegetables but is struggling to consume adequate protein.  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  She had some eating challenges over the holidays while visiting family but has been working on getting back into her normal meal planning and prepping routine.      PHYSICAL EXAM:  Height 5' 8 (1.727 m), weight 177  lb (80.3 kg). Body mass index is 26.91 kg/m.  DIAGNOSTIC DATA REVIEWED BY MYSELF TODAY:  BMET    Component Value Date/Time   NA 140 03/19/2024 0749   K 4.1 03/19/2024 0749   CL 104 03/19/2024 0749   CO2 21 03/19/2024 0749   GLUCOSE 84 03/19/2024 0749   GLUCOSE 92 04/25/2023 0840   BUN 12 03/19/2024 0749   CREATININE 0.81 03/19/2024 0749   CALCIUM 9.0 03/19/2024 0749   GFRNONAA >60 12/03/2020 1649   GFRAA 124 05/29/2018 1049   Lab Results  Component Value Date   HGBA1C 4.7 (L) 03/19/2024   HGBA1C 5.5 01/18/2018   Lab Results  Component Value Date   INSULIN  5.1 03/19/2024   INSULIN  22.0 01/18/2018   Lab Results  Component Value Date   TSH 3.280 03/19/2024   CBC    Component Value Date/Time   WBC 3.9 (L) 04/25/2023 0840   RBC 4.43 04/25/2023 0840   HGB 13.7 04/25/2023 0840   HGB 13.9 05/20/2021 0935   HCT 39.4 04/25/2023 0840   HCT 40.3 05/20/2021 0935   PLT 209.0 04/25/2023 0840   PLT 209 05/20/2021 0935   MCV 89.0 04/25/2023 0840   MCV 87 05/20/2021 0935   MCH 29.9 05/20/2021 0935   MCH 30.2 12/03/2020 1649   MCHC 34.9 04/25/2023 0840   RDW 12.8 04/25/2023 0840   RDW 12.4 05/20/2021 0935   Iron Studies No results found for: IRON, TIBC, FERRITIN, IRONPCTSAT Lipid Panel     Component Value Date/Time  CHOL 180 04/25/2023 0840   CHOL 210 (H) 12/22/2022 1244   TRIG 117.0 04/25/2023 0840   HDL 48.00 04/25/2023 0840   HDL 52 12/22/2022 1244   CHOLHDL 4 04/25/2023 0840   VLDL 23.4 04/25/2023 0840   LDLCALC 108 (H) 04/25/2023 0840   LDLCALC 138 (H) 12/22/2022 1244   Hepatic Function Panel     Component Value Date/Time   PROT 6.3 03/19/2024 0749   ALBUMIN 4.2 03/19/2024 0749   AST 15 03/19/2024 0749   ALT 11 03/19/2024 0749   ALKPHOS 43 03/19/2024 0749   BILITOT 0.4 03/19/2024 0749      Component Value Date/Time   TSH 3.280 03/19/2024 0749   Nutritional Lab Results  Component Value Date   VD25OH 40.7 03/19/2024   VD25OH 94.4  12/22/2022   VD25OH 80.8 02/18/2022     Assessment and Plan Assessment & Plan  Obesity and Emotional Eating Behaviors Management is ongoing. She adheres to the category three eating plan approximately 50% of the time. Reports weight loss since the last visit, with a current weight of 174 lbs. Engages in cardio exercise at the gym two days a week for 30 minutes. Struggles with protein intake but is working on increasing fruits and vegetables. Focus on health rather than weight loss is encouraged. - Continue category three eating plan. - Encouraged increased protein intake. - Continue cardio exercise at the gym two days a week for 30 minutes. - Scheduled follow-up appointment in six weeks. - Continue Wellbutrin  and we will refill as needed      Patients who are on anti-obesity medications are counseled on the importance of maintaining healthy lifestyle habits, including balanced nutrition, regular physical activity, and behavioral modifications,  Medication is an adjunct to, not a replacement for, lifestyle changes and that the long-term success and weight maintenance depend on continued adherence to these strategies.   Tamala was informed of the importance of frequent follow up visits to maximize her success with intensive lifestyle modifications for her obesity and obesity related health conditions as recommended by USPSTF and CMS guidelines  Louann Penton, MD   "

## 2024-06-11 ENCOUNTER — Ambulatory Visit (INDEPENDENT_AMBULATORY_CARE_PROVIDER_SITE_OTHER): Admitting: Family Medicine

## 2024-06-28 ENCOUNTER — Ambulatory Visit: Admitting: Internal Medicine

## 2024-07-30 ENCOUNTER — Ambulatory Visit (INDEPENDENT_AMBULATORY_CARE_PROVIDER_SITE_OTHER): Admitting: Family Medicine

## 2025-04-29 ENCOUNTER — Encounter: Admitting: Family
